# Patient Record
Sex: Female | Born: 1990 | Race: White | Hispanic: No | Marital: Single | Smoking: Never smoker
Health system: Southern US, Community
[De-identification: ages and names within clinical notes are randomized; demographics above are authoritative.]

## PROBLEM LIST (undated history)

## (undated) ENCOUNTER — Inpatient Hospital Stay (HOSPITAL_COMMUNITY): Payer: Self-pay

## (undated) DIAGNOSIS — F419 Anxiety disorder, unspecified: Secondary | ICD-10-CM

## (undated) DIAGNOSIS — B999 Unspecified infectious disease: Secondary | ICD-10-CM

## (undated) DIAGNOSIS — Z789 Other specified health status: Secondary | ICD-10-CM

## (undated) DIAGNOSIS — G43909 Migraine, unspecified, not intractable, without status migrainosus: Secondary | ICD-10-CM

## (undated) HISTORY — DX: Anxiety disorder, unspecified: F41.9

## (undated) HISTORY — DX: Migraine, unspecified, not intractable, without status migrainosus: G43.909

## (undated) HISTORY — PX: OTHER SURGICAL HISTORY: SHX169

## (undated) HISTORY — PX: WISDOM TOOTH EXTRACTION: SHX21

---
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
## 2011-09-24 ENCOUNTER — Emergency Department (HOSPITAL_COMMUNITY)
Admission: EM | Admit: 2011-09-24 | Discharge: 2011-09-24 | Disposition: A | Discharge status: Home / Self Care | Payer: Self-pay | Attending: Emergency Medicine | Admitting: Emergency Medicine

## 2011-09-24 ENCOUNTER — Encounter (HOSPITAL_COMMUNITY): Payer: Self-pay

## 2011-09-24 ENCOUNTER — Emergency Department (HOSPITAL_COMMUNITY): Payer: Self-pay

## 2011-09-24 DIAGNOSIS — Y92838 Other recreation area as the place of occurrence of the external cause: Secondary | ICD-10-CM | POA: Insufficient documentation

## 2011-09-24 DIAGNOSIS — S93401A Sprain of unspecified ligament of right ankle, initial encounter: Secondary | ICD-10-CM

## 2011-09-24 DIAGNOSIS — Y9239 Other specified sports and athletic area as the place of occurrence of the external cause: Secondary | ICD-10-CM | POA: Insufficient documentation

## 2011-09-24 DIAGNOSIS — S93409A Sprain of unspecified ligament of unspecified ankle, initial encounter: Secondary | ICD-10-CM | POA: Insufficient documentation

## 2011-09-24 DIAGNOSIS — X500XXA Overexertion from strenuous movement or load, initial encounter: Secondary | ICD-10-CM | POA: Insufficient documentation

## 2011-09-24 MED ORDER — IBUPROFEN 800 MG PO TABS: 800.0000 mg | ORAL_TABLET | Freq: Three times a day (TID) | ORAL | Status: AC

## 2011-09-24 MED ORDER — IBUPROFEN 800 MG PO TABS
800.0000 mg | ORAL_TABLET | Freq: Once | ORAL | Status: AC
  Administered 2011-09-24: 800 mg via ORAL
  Filled 2011-09-24: qty 1

## 2011-09-24 MED ORDER — TETANUS-DIPHTH-ACELL PERTUSSIS 5-2.5-18.5 LF-MCG/0.5 IM SUSP
0.5000 mL | Freq: Once | INTRAMUSCULAR | Status: AC
  Administered 2011-09-24: 0.5 mL via INTRAMUSCULAR
  Filled 2011-09-24: qty 0.5

## 2011-09-24 NOTE — Discharge Instructions (Signed)
Ankle Sprain An ankle sprain is an injury to the strong, fibrous tissues (ligaments) that hold the bones of your ankle joint together.  CAUSES Ankle sprain usually is caused by a fall or by twisting your ankle. People who participate in sports are more prone to these types of injuries.  SYMPTOMS  Symptoms of ankle sprain include:  Pain in your ankle. The pain may be present at rest or only when you are trying to stand or walk.   Swelling.   Bruising. Bruising may develop immediately or within 1 to 2 days after your injury.   Difficulty standing or walking.  DIAGNOSIS  Your caregiver will ask you details about your injury and perform a physical exam of your ankle to determine if you have an ankle sprain. During the physical exam, your caregiver will press and squeeze specific areas of your foot and ankle. Your caregiver will try to move your ankle in certain ways. An X-ray exam may be done to be sure a bone was not broken or a ligament did not separate from one of the bones in your ankle (avulsion).  TREATMENT  Certain types of braces can help stabilize your ankle. Your caregiver can make a recommendation for this. Your caregiver may recommend the use of medication for pain. If your sprain is severe, your caregiver may refer you to a surgeon who helps to restore function to parts of your skeletal system (orthopedist) or a physical therapist. HOME CARE INSTRUCTIONS  Apply ice to your injury for 1 to 2 days or as directed by your caregiver. Applying ice helps to reduce inflammation and pain.  Put ice in a plastic bag.   Place a towel between your skin and the bag.   Leave the ice on for 15 to 20 minutes at a time, every 2 hours while you are awake.   Take over-the-counter or prescription medicines for pain, discomfort, or fever only as directed by your caregiver.   Keep your injured leg elevated, when possible, to lessen swelling.   If your caregiver recommends crutches, use them as  instructed. Gradually, put weight on the affected ankle. Continue to use crutches or a cane until you can walk without feeling pain in your ankle.   If you have a plaster splint, wear the splint as directed by your caregiver. Do not rest it on anything harder than a pillow the first 24 hours. Do not put weight on it. Do not get it wet. You may take it off to take a shower or bath.   You may have been given an elastic bandage to wear around your ankle to provide support. If the elastic bandage is too tight (you have numbness or tingling in your foot or your foot becomes cold and blue), adjust the bandage to make it comfortable.   If you have an air splint, you may blow more air into it or let air out to make it more comfortable. You may take your splint off at night and before taking a shower or bath.   Wiggle your toes in the splint several times per day if you are able.   Soak ankle and feet in luke-warm water with Epson salt 4 times daily SEEK MEDICAL CARE IF:   You have an increase in bruising, swelling, or pain.   Your toes feel cold.   Pain relief is not achieved with medication.  SEEK IMMEDIATE MEDICAL CARE IF: Your toes are numb or blue or you have severe pain. MAKE  SURE YOU:   Understand these instructions.   Will watch your condition.   Will get help right away if you are not doing well or get worse.  Document Released: 04/12/2005 Document Revised: 04/01/2011 Document Reviewed: 11/15/2007 [facility] Patient Information 2012 [place], [place].

## 2011-09-24 NOTE — ED Notes (Signed)
Pt given ice pack to apply to ankle to help with swelling.

## 2011-09-24 NOTE — ED Notes (Signed)
Pt presents with R foot and ankle pain after jumping off a 4-foot fence last night, twisting ankle at impact.

## 2011-09-24 NOTE — Progress Notes (Signed)
Orthopedic Tech Progress Note Patient Details:  Marie Cochran [birth year] [id number]  Ortho Devices Type of Ortho Device: ASO;Crutches Ortho Device/Splint Location: right foot Ortho Device/Splint Interventions: Application   Christal Lagerstrom T 09/24/2011, 1:28 PM

## 2011-09-24 NOTE — ED Provider Notes (Signed)
History     CSN: [id number]  Arrival date & time 09/24/11  1136   First MD Initiated Contact with Patient 09/24/11 1143      Chief Complaint  Patient presents with  . Foot Pain    (Consider location/radiation/quality/duration/timing/severity/associated sxs/prior treatment) HPI  21 year old female presents complaining of right foot and ankle injury. Pt sts she was hanging out with friends last night at the pool after hour.  Sts she was "kicked out of the pool".  She went home then realized she has come return to retrieved her cell phone from pool. Patient reports she jumped off a 4 foot fence at the pool and landed on the right foot. Reports twisting her ankle at impact. Able to walk afterward but has notice increase pain and swelling to her ankle and foot.  Pain is throbbing, constant, worsening with movement and improves with rest. Also has a skin tear to the posterior aspect of foot from fall.  Do not recall last tetanus shot.  Denies knee or hip pain.  Denies any other injury.  Able to drive to ER today.  Has not taking anything for pain.    History reviewed. No pertinent past medical history.  History reviewed. No pertinent past surgical history.  No family history on file.  History  Substance Use Topics  . Smoking status: Never Smoker   . Smokeless tobacco: Not on file  . Alcohol Use: No    OB History    Grav Para Term Preterm Abortions TAB SAB Ect Mult Living                  Review of Systems  Constitutional: Negative for fever.  HENT: Negative for neck pain.   Musculoskeletal: Positive for joint swelling and gait problem. Negative for back pain.  Skin: Positive for wound.  Neurological: Negative for numbness and headaches.    Allergies  Review of patient's allergies indicates no known allergies.  Home Medications  No current outpatient prescriptions on file.  BP 116/72  Pulse 107  Temp(Src) 98.4 F (36.9 C) (Oral)  Resp 18  Ht 5\' 3"  (1.6 m)  Wt 131 lb  (59.421 kg)  BMI 23.21 kg/m2  SpO2 96%  LMP 09/23/2011  Physical Exam  Nursing note and vitals reviewed. Constitutional: She appears well-developed and well-nourished. No distress.  HENT:  Head: Atraumatic.  Eyes: Conjunctivae are normal.  Neck: Normal range of motion. Neck supple.  Musculoskeletal:       Right knee: Normal.       Right ankle: She exhibits decreased range of motion, swelling and ecchymosis. She exhibits no deformity. tenderness. Lateral malleolus, medial malleolus and head of 5th metatarsal tenderness found. No posterior TFL and no proximal fibula tenderness found.       Right foot: She exhibits decreased range of motion, tenderness, bony tenderness and swelling. She exhibits normal capillary refill, no crepitus and no deformity.       Feet:  Neurological: She is alert.  Skin: Skin is warm. No rash noted.    ED Course  Procedures (including critical care time)  Labs Reviewed - No data to display No results found.   No diagnosis found.  No results found for this or any previous visit. Dg Ankle Complete Right  09/24/2011  *RADIOLOGY REPORT*  Clinical Data: Lateral ankle pain.  Bruising.  RIGHT ANKLE - COMPLETE 3+ VIEW  Comparison: Foot 09/24/2011.  Findings: Marked lateral malleolar soft tissue swelling is present. There is no fracture identified.  The alignment the ankle is anatomic.  Lateral view is oblique.  Prominent posterior process of the talus.  Talar dome is intact.  IMPRESSION: Lateral malleolar soft tissue swelling.  Original Report Authenticated By: Andreas Newport, M.D.   Dg Foot Complete Right  09/24/2011  *RADIOLOGY REPORT*  Clinical Data: Right foot pain.  Swelling and bruising laterally.  RIGHT FOOT COMPLETE - 3+ VIEW  Comparison: None.  Findings: Anatomic alignment bones of the right foot.  Lateral soft tissue swelling is present over the midfoot and proximal forefoot. There is no fracture.  No radiopaque foreign body.  IMPRESSION: Mild lateral soft  tissue swelling of the foot.  Original Report Authenticated By: Andreas Newport, M.D.      MDM  Mechanical injury to R ankle and foot from jumping @ 38ft height.  Xray ordered.  Pt has some minor skin tears.  tdap given.  Ibuprofen for pain.   1:04 PM Xray of R ankle and foot shows soft tissue swelling but no acute fx or dislocation.  Reassurance given, Care instruction given, ASO and crutches provided. Referral provided.  Pt voice understanding and agrees with plan       Fayrene Helper, PA-C 09/24/11 1307

## 2011-09-24 NOTE — ED Provider Notes (Signed)
Medical screening examination/treatment/procedure(s) were performed by non-physician practitioner and as supervising physician I was immediately available for consultation/collaboration.    Lien Lyman L Devinn Hurwitz, MD 09/24/11 1915 

## 2011-10-06 ENCOUNTER — Emergency Department (INDEPENDENT_AMBULATORY_CARE_PROVIDER_SITE_OTHER): Payer: Self-pay

## 2011-10-06 ENCOUNTER — Emergency Department (INDEPENDENT_AMBULATORY_CARE_PROVIDER_SITE_OTHER)
Admission: EM | Admit: 2011-10-06 | Discharge: 2011-10-06 | Disposition: A | Discharge status: Home / Self Care | Payer: Self-pay | Source: Home / Self Care | Attending: Emergency Medicine | Admitting: Emergency Medicine

## 2011-10-06 ENCOUNTER — Encounter (HOSPITAL_COMMUNITY): Payer: Self-pay | Admitting: Emergency Medicine

## 2011-10-06 DIAGNOSIS — S9030XA Contusion of unspecified foot, initial encounter: Secondary | ICD-10-CM

## 2011-10-06 DIAGNOSIS — T148XXA Other injury of unspecified body region, initial encounter: Secondary | ICD-10-CM

## 2011-10-06 DIAGNOSIS — S9031XA Contusion of right foot, initial encounter: Secondary | ICD-10-CM

## 2011-10-06 LAB — DIFFERENTIAL
Lymphs Abs: 1.8 10*3/uL (ref 0.7–4.0)
Monocytes Relative: 6 % (ref 3–12)
Neutro Abs: 5.1 10*3/uL (ref 1.7–7.7)
Neutrophils Relative %: 69 % (ref 43–77)

## 2011-10-06 LAB — CBC
Hemoglobin: 14.1 g/dL (ref 12.0–15.0)
RBC: 4.38 MIL/uL (ref 3.87–5.11)

## 2011-10-06 MED ORDER — CEPHALEXIN 500 MG PO CAPS: 500.0000 mg | ORAL_CAPSULE | Freq: Three times a day (TID) | ORAL | Status: AC

## 2011-10-06 MED ORDER — TRAMADOL HCL 50 MG PO TABS: 100.0000 mg | ORAL_TABLET | Freq: Three times a day (TID) | ORAL | Status: AC | PRN

## 2011-10-06 MED ORDER — MELOXICAM 15 MG PO TABS: 15.0000 mg | ORAL_TABLET | Freq: Every day | ORAL | Status: DC

## 2011-10-06 NOTE — Discharge Instructions (Signed)
Contusion A contusion is a deep bruise. Contusions are the result of an injury that caused bleeding under the skin. The contusion may turn blue, purple, or yellow. Minor injuries will give you a painless contusion, but more severe contusions may stay painful and swollen for a few weeks.  CAUSES  A contusion is usually caused by a blow, trauma, or direct force to an area of the body. SYMPTOMS   Swelling and redness of the injured area.   Bruising of the injured area.   Tenderness and soreness of the injured area.   Pain.  DIAGNOSIS  The diagnosis can be made by taking a history and physical exam. An X-ray, CT scan, or MRI may be needed to determine if there were any associated injuries, such as fractures. TREATMENT  Specific treatment will depend on what area of the body was injured. In general, the best treatment for a contusion is resting, icing, elevating, and applying cold compresses to the injured area. Over-the-counter medicines may also be recommended for pain control. Ask your caregiver what the best treatment is for your contusion. HOME CARE INSTRUCTIONS   Put ice on the injured area.   Put ice in a plastic bag.   Place a towel between your skin and the bag.   Leave the ice on for 15 to 20 minutes, 3 to 4 times a day.   Only take over-the-counter or prescription medicines for pain, discomfort, or fever as directed by your caregiver. Your caregiver may recommend avoiding anti-inflammatory medicines (aspirin, ibuprofen, and naproxen) for 48 hours because these medicines may increase bruising.   Rest the injured area.   If possible, elevate the injured area to reduce swelling.  SEEK IMMEDIATE MEDICAL CARE IF:   You have increased bruising or swelling.   You have pain that is getting worse.   Your swelling or pain is not relieved with medicines.  MAKE SURE YOU:   Understand these instructions.   Will watch your condition.   Will get help right away if you are not  doing well or get worse.  Document Released: 01/20/2005 Document Revised: 04/01/2011 Document Reviewed: 02/15/2011 [facility] Patient Information 2012 [place], [place].Contusion (Bruise) of Foot Injury to the foot causes bruises (contusions). Contusions are caused by bleeding from small blood vessels that allow blood to leak out into the muscles, cord-like structures that attach muscle to bone (tendons), and/or other soft tissue.  CAUSES  Contusions of the foot are common. Bruises are frequently seen from:  Contact sports injuries.   The use of medications that thin the blood (anti-coagulants).   Aspirin and non-steroidal anti-inflammatory agents that decrease the clotting ability.   People with vitamin deficiencies.  SYMPTOMS  Signs of foot injury include pain and swelling. At first there may be discoloration from blood under the skin. This will appear blue to purple in color. As the bruise ages, the color turns yellow. Swelling may limit the movement of the toes.  Complications from foot injury may include:  Collections of blood leading to disability if calcium deposits form. These can later limit movement in the foot.   Infection of the foot if there are breaks in the skin.   Rupture of the tendons that may need surgical repair.  DIAGNOSIS  Diagnosing foot injuries can be made by observation. If problems continue, X-rays may be needed to make sure there are no broken bones (fractures). Continuing problems may require physical therapy.  HOME CARE INSTRUCTIONS   Apply ice to the injury for  15 to 20 minutes, 3 to 4 times per day. Put the ice in a plastic bag and place a towel between the bag of ice and your skin.   An elastic wrap (like an Ace bandage) may be used to keep swelling down.   Keep foot elevated to reduce swelling and discomfort.   Try to avoid standing or walking while the foot is painful. Do not resume use until instructed by your caregiver. Then begin use gradually. If  pain develops, decrease use and continue the above measures. Gradually increase activities that do not cause discomfort until you slowly have normal use.   Only take over-the-counter or prescription medicines for pain, discomfort, or fever as directed by your caregiver. Use only if your caregiver has not given medications that would interfere.   Begin daily rehabilitation exercises when supportive wrapping is no longer needed.   Use ice massage for 10 minutes before and after workouts. Fill a large styrofoam cup with water and freeze. Tear a small amount of foam from the top so ice protrudes. Massage ice firmly over the injured area in a circle about the size of a softball.   Always eat a well balanced diet.   Follow all instructions for follow up with your caregiver, any orthopedic referrals, physical therapy and rehabilitation. Any delay in obtaining necessary care could result in delayed healing, and temporary or permanent disability.  SEEK IMMEDIATE MEDICAL CARE IF:   Your pain and swelling increase, or pain is uncontrolled with medications.   You have loss of feeling in your foot, or your foot turns cold or blue.   An oral temperature above 102 F (38.9 C) develops, not controlled by medication.   Your foot becomes warm to touch, or you have more pain with movement of your toes.   You have a foot contusion that does not improve in 1 or 2 days.   Skin is broken and signs of infection occur (drainage, increasing pain, fever, headache, muscle aches, dizziness or a general ill feeling).   You develop new, unexplained symptoms, or an increase of the symptoms that brought you to your caregiver.  MAKE SURE YOU:   Understand these instructions.   Will watch your condition.   Will get help right away if you are not doing well or get worse.  Document Released: 02/01/2006 Document Revised: 04/01/2011 Document Reviewed: 03/16/2011 [facility] Patient Information 2012 [place],  [place].Hematoma A hematoma is a pocket of blood that collects under the skin, in an organ, in a body space, in a joint space, or in other tissue. The blood can clot to form a lump that you can see and feel. The lump is often firm, sore, and sometimes even painful and tender. Most hematomas get better in a few days to weeks. However, some hematomas may be serious and require medical care.Hematomas can range in size from very small to very large. CAUSES  A hematoma can be caused by a blunt or penetrating injury. It can also be caused by leakage from a blood vessel under the skin. Spontaneous leakage from a blood vessel is more likely to occur in elderly people, especially those taking blood thinners. Sometimes, a hematoma can develop after certain medical procedures. SYMPTOMS  Unlike a bruise, a hematoma forms a firm lump that you can feel. This lump is the collection of blood. The collection of blood can also cause your skin to turn a blue to dark blue color. If the hematoma is close to the surface  of the skin, it often produces a yellowish color in the skin. DIAGNOSIS  Your caregiver can determine whether you have a hematoma based on your history and a physical exam. TREATMENT  Hematomas usually go away on their own over time. Rarely does the blood need to be drained out of the body. HOME CARE INSTRUCTIONS   Put ice on the injured area.   Put ice in a plastic bag.   Place a towel between your skin and the bag.   Leave the ice on for 15 to 20 minutes, 3 to 4 times a day for the first 1 to 2 days.   After the first 2 days, switch to using warm compresses on the hematoma.   Elevate the injured area to help decrease pain and swelling. Wrapping the area with an elastic bandage may also be helpful. Compression helps to reduce swelling and promotes shrinking of the hematoma. Make sure the bandage is not wrapped too tight.   If your hematoma is on a lower extremity and is painful, crutches may be  helpful for a couple days.   Only take over-the-counter or prescription medicines for pain, discomfort, or fever as directed by your caregiver. Most patients can take acetaminophen or ibuprofen for the pain.  SEEK IMMEDIATE MEDICAL CARE IF:   You have increasing pain, or your pain is not controlled with medicine.   You have a fever.   You have worsening swelling or discoloration.   Your skin over the hematoma breaks or starts bleeding.  MAKE SURE YOU:   Understand these instructions.   Will watch your condition.   Will get help right away if you are not doing well or get worse.  Document Released: 11/25/2003 Document Revised: 04/01/2011 Document Reviewed: 12/14/2010 [facility] Patient Information 2012 [place], [place].

## 2011-10-06 NOTE — ED Provider Notes (Signed)
Chief Complaint  Patient presents with  . Foot Pain    History of Present Illness:   Marie Cochran is a 21 year old female who injured her right foot and ankle on May 30. She jumped about 4 feet over a fence, landing on her right foot. She did not hear a pop but it swelled up immediately. She went to the emergency room thereafter. X-rays were negative of the foot and ankle. She was given crutches and an ASO. The foot and ankle don't feel any better. She still has lots of swelling and pain it hurts to walk. She denies any numbness or tingling. There's been no fever or chills. She does have a couple of small breaks in the skin.  Review of Systems:  Other than noted above, the patient denies any of the following symptoms: Systemic:  No fevers, chills, sweats, or aches.  No fatigue or tiredness. Musculoskeletal:  No joint pain, arthritis, bursitis, swelling, back pain, or neck pain. Neurological:  No muscular weakness, paresthesias, headache, or trouble with speech or coordination.  No dizziness.   PMFSH:  Past medical history, family history, social history, meds, and allergies were reviewed.  Physical Exam:   Vital signs:  BP 131/76  Pulse 95  Temp 98.5 F (36.9 C) (Oral)  Resp 20  SpO2 98%  LMP 09/26/2011 Gen:  Alert and oriented times 3.  In no distress. Musculoskeletal: There is marketed swelling, redness, heat, and pain to palpation of the dorsum of foot. She has a couple of small abrasions on the heel, but these don't look infected. The entire foot is tender to palpation as is the ankle. The ankle has a limited range of motion with pain. Otherwise, all joints had a full a ROM with no swelling, bruising or deformity.  No edema, pulses full. Extremities were warm and pink.  Capillary refill was brisk.  Skin:  Clear, warm and dry.  No rash. Neuro:  Alert and oriented times 3.  Muscle strength was normal.  Sensation was intact to light touch.   Radiology:  Dg Ankle Complete Right  10/06/2011   *RADIOLOGY REPORT*  Clinical Data: Larey Seat, pain  RIGHT ANKLE - COMPLETE 3+ VIEW  Comparison: 09/24/2011  Findings: Moderate lateral soft tissue swelling.  No fracture or dislocation.  Similar appearance to priors.  IMPRESSION: Lateral soft tissue swelling persists.  Original Report Authenticated By: Elsie Stain, M.D.   Dg Foot Complete Right  10/06/2011  *RADIOLOGY REPORT*  Clinical Data: Fall.  Pain.  RIGHT FOOT COMPLETE - 3+ VIEW  Comparison: None.  Findings: Soft tissues of the foot appear swollen.  No fracture or dislocation is identified.  No radiopaque foreign body.  IMPRESSION: Soft tissue swelling without underlying acute bony or joint abnormality.  Original Report Authenticated By: Bernadene Bell. Maricela Curet, M.D.   Results for orders placed during the hospital encounter of 10/06/11  CBC      Component Value Range   WBC 7.4  4.0 - 10.5 K/uL   RBC 4.38  3.87 - 5.11 MIL/uL   Hemoglobin 14.1  12.0 - 15.0 g/dL   HCT 45.4  09.8 - 11.9 %   MCV 91.1  78.0 - 100.0 fL   MCH 32.2  26.0 - 34.0 pg   MCHC 35.3  30.0 - 36.0 g/dL   RDW 14.7  82.9 - 56.2 %   Platelets 323  150 - 400 K/uL  DIFFERENTIAL      Component Value Range   Neutrophils Relative 69  43 -  77 %   Neutro Abs 5.1  1.7 - 7.7 K/uL   Lymphocytes Relative 24  12 - 46 %   Lymphs Abs 1.8  0.7 - 4.0 K/uL   Monocytes Relative 6  3 - 12 %   Monocytes Absolute 0.5  0.1 - 1.0 K/uL   Eosinophils Relative 1  0 - 5 %   Eosinophils Absolute 0.1  0.0 - 0.7 K/uL   Basophils Relative 0  0 - 1 %   Basophils Absolute 0.0  0.0 - 0.1 K/uL    Course in Urgent Care Center:   She was placed in a [place] [facility] and told to continue to use of crutches and to followup with an orthopedist as soon as possible.  Assessment:  The primary encounter diagnosis was Contusion of right foot. A diagnosis of Hematoma was also pertinent to this visit. This could be just a hematoma is taking its time to resolve. Alternatively there might be some infection, but her white  blood cell count was normal. There could be an occult fracture.  Plan:   1.  The following meds were prescribed:   New Prescriptions   CEPHALEXIN (KEFLEX) 500 MG CAPSULE    Take 1 capsule (500 mg total) by mouth 3 (three) times daily.   MELOXICAM (MOBIC) 15 MG TABLET    Take 1 tablet (15 mg total) by mouth daily.   TRAMADOL (ULTRAM) 50 MG TABLET    Take 2 tablets (100 mg total) by mouth every 8 (eight) hours as needed for pain.   2.  The patient was instructed in symptomatic care, including rest and activity, elevation, application of ice and compression.  Appropriate handouts were given. 3.  The patient was told to return if becoming worse in any way, if no better in 3 or 4 days, and given some red flag symptoms that would indicate earlier return.   4.  The patient was told to follow up with Dr. Victorino Dike as soon as possible.   Reuben Likes, MD 10/06/11 2127

## 2011-10-06 NOTE — ED Notes (Signed)
Reports 13 days ago injured foot.  Patient climbed a fence, jumped down and landed wrong on foot.  Seen in ed for the same.  Told not broken.  Foot has continued to be painful.  Has a semicircle laceration to back of foot, achilles.  No bleeding. Visible, healing scrape to top of foot.  Foot is swollen, red, visible blue-purple around toes.  Pedal pulses palpable.  Able to move toes, but very painful

## 2011-10-06 NOTE — ED Notes (Signed)
Spoke to [facility] on [address].  Received 2 of the 3 scripts e-scribed. Tramadol not located by pharmacy. Called in script for tramadol.  Called in as documented on avs.

## 2011-10-28 ENCOUNTER — Encounter: Payer: Self-pay | Admitting: Family Medicine

## 2011-10-28 ENCOUNTER — Other Ambulatory Visit: Payer: Self-pay | Admitting: Family Medicine

## 2011-10-28 DIAGNOSIS — Z01419 Encounter for gynecological examination (general) (routine) without abnormal findings: Secondary | ICD-10-CM | POA: Insufficient documentation

## 2011-10-28 DIAGNOSIS — F419 Anxiety disorder, unspecified: Secondary | ICD-10-CM | POA: Insufficient documentation

## 2011-11-11 ENCOUNTER — Encounter: Payer: Self-pay | Admitting: Family Medicine

## 2011-11-11 ENCOUNTER — Ambulatory Visit (INDEPENDENT_AMBULATORY_CARE_PROVIDER_SITE_OTHER): Payer: Self-pay | Admitting: Family Medicine

## 2011-11-11 VITALS — BP 106/71 | HR 76 | Ht 64.0 in | Wt 131.3 lb

## 2011-11-11 DIAGNOSIS — S93409A Sprain of unspecified ligament of unspecified ankle, initial encounter: Secondary | ICD-10-CM

## 2011-11-11 NOTE — Progress Notes (Signed)
S: Pt comes in today for new patient visit. Previous PCP was Dr. Arvella Nigh (last seen May 2013), is changing clinics because Medicaid ran out.  Will be applying for orange card.   Last pap within 1 year- normal.   Works in the kitchen in a cafe, off for medical leave for ankle   RIGHT ANKLE SPRAIN Sprained it 5/30, went to ED and had xrays done which were negative.  Went to back to ED about 2-3 weeks later because was still having swelling- was given antibiotics and antiinflammatory at that time. Since that time, swelling improved a little but still having issues with this.  Redness improved after antibiotics.  Is able to bear weight now (w2as in a boot for a few weeks).  No longer icing ankle. Has better ROM than previously.  Wore the boot for about 2 weeks.  Also was given a brace.   Injury occurred after jumping a fence, landed on the right ankle.  Felt pain immediately, became swollen right away, unable to bear weight at that time.    ROS: Per HPI  History  Smoking status  . Never Smoker   Smokeless tobacco  . Never Used    O:  Filed Vitals:   11/11/11 1402  BP: 106/71  Pulse: 76    Gen: NAD CV: RRR, no murmur Pulm: CTA bilat, no wheezes or crackles Ext: Warm, no chronic skin changes; slight diffuse swelling of right ankle compared to left, no bruising, no edema, pedal pulses intact bilaterally; mildly limited ROM in right ankle, strength 5/5 in ankle, mild pain with resisted dorsi and plantar flexion, no pain with internal or external rotation   A/P: 21 y.o. female p/w new pt visit -See problem list -f/u in 2 weeks if not improved

## 2011-11-11 NOTE — Assessment & Plan Note (Signed)
Per pt, has improved some but is not back to baseline.  Negative ankle xray x2 (at time of injury and 3 weeks later)- could be occult fx but less likely.  Will continue ice and NSAIDs plus ABC exercises for strength/stretching.  F/u if still not improved in 2 weeks.  Unable to see Ortho bc no insurance and cannot afford to pay.

## 2011-11-11 NOTE — Patient Instructions (Signed)
It was nice to meet you today!  For your ankle, start wearing the brace again, every day.  Try to limit the amount of walking/standing as much as you can for now.  Keep it elevated. ICE it for at least 15 minutes at least 3 times a day, while there is still swelling. Keep moving your ankle-- write the alphabet with your whole foot at least 3 times a day. You could take ibuprofen 800mg  3 times a day for the next 2 weeks.    Come back in about 2 weeks.

## 2012-03-12 ENCOUNTER — Emergency Department (HOSPITAL_COMMUNITY): Payer: Self-pay

## 2012-03-12 ENCOUNTER — Encounter (HOSPITAL_COMMUNITY): Payer: Self-pay | Admitting: Surgery

## 2012-03-12 ENCOUNTER — Inpatient Hospital Stay (HOSPITAL_COMMUNITY)
Admission: EM | Admit: 2012-03-12 | Discharge: 2012-03-16 | DRG: 958 | Disposition: A | Discharge status: Rehabilitation Facility | Payer: MEDICAID | Attending: General Surgery | Admitting: General Surgery

## 2012-03-12 ENCOUNTER — Inpatient Hospital Stay (HOSPITAL_COMMUNITY): Payer: Self-pay

## 2012-03-12 ENCOUNTER — Other Ambulatory Visit: Payer: Self-pay

## 2012-03-12 DIAGNOSIS — F329 Major depressive disorder, single episode, unspecified: Secondary | ICD-10-CM | POA: Diagnosis present

## 2012-03-12 DIAGNOSIS — S56429A Laceration of extensor muscle, fascia and tendon of unspecified finger at forearm level, initial encounter: Secondary | ICD-10-CM | POA: Diagnosis present

## 2012-03-12 DIAGNOSIS — Y92410 Unspecified street and highway as the place of occurrence of the external cause: Secondary | ICD-10-CM

## 2012-03-12 DIAGNOSIS — F1010 Alcohol abuse, uncomplicated: Secondary | ICD-10-CM | POA: Diagnosis present

## 2012-03-12 DIAGNOSIS — S270XXA Traumatic pneumothorax, initial encounter: Secondary | ICD-10-CM

## 2012-03-12 DIAGNOSIS — S0120XA Unspecified open wound of nose, initial encounter: Secondary | ICD-10-CM | POA: Diagnosis present

## 2012-03-12 DIAGNOSIS — S61209A Unspecified open wound of unspecified finger without damage to nail, initial encounter: Secondary | ICD-10-CM | POA: Diagnosis present

## 2012-03-12 DIAGNOSIS — S42209B Unspecified fracture of upper end of unspecified humerus, initial encounter for open fracture: Principal | ICD-10-CM | POA: Diagnosis present

## 2012-03-12 DIAGNOSIS — S0181XA Laceration without foreign body of other part of head, initial encounter: Secondary | ICD-10-CM | POA: Diagnosis present

## 2012-03-12 DIAGNOSIS — S7000XA Contusion of unspecified hip, initial encounter: Secondary | ICD-10-CM | POA: Diagnosis present

## 2012-03-12 DIAGNOSIS — S0180XA Unspecified open wound of other part of head, initial encounter: Secondary | ICD-10-CM | POA: Diagnosis present

## 2012-03-12 DIAGNOSIS — S0003XA Contusion of scalp, initial encounter: Secondary | ICD-10-CM | POA: Diagnosis present

## 2012-03-12 DIAGNOSIS — S025XXA Fracture of tooth (traumatic), initial encounter for closed fracture: Secondary | ICD-10-CM | POA: Diagnosis present

## 2012-03-12 DIAGNOSIS — S42302B Unspecified fracture of shaft of humerus, left arm, initial encounter for open fracture: Secondary | ICD-10-CM | POA: Diagnosis present

## 2012-03-12 DIAGNOSIS — S01501A Unspecified open wound of lip, initial encounter: Secondary | ICD-10-CM | POA: Diagnosis present

## 2012-03-12 DIAGNOSIS — S060X9A Concussion with loss of consciousness of unspecified duration, initial encounter: Secondary | ICD-10-CM | POA: Diagnosis present

## 2012-03-12 DIAGNOSIS — S060XAA Concussion with loss of consciousness status unknown, initial encounter: Secondary | ICD-10-CM | POA: Diagnosis present

## 2012-03-12 DIAGNOSIS — S21119A Laceration without foreign body of unspecified front wall of thorax without penetration into thoracic cavity, initial encounter: Secondary | ICD-10-CM | POA: Diagnosis present

## 2012-03-12 DIAGNOSIS — R000 Tachycardia, unspecified: Secondary | ICD-10-CM | POA: Diagnosis present

## 2012-03-12 DIAGNOSIS — S61219A Laceration without foreign body of unspecified finger without damage to nail, initial encounter: Secondary | ICD-10-CM

## 2012-03-12 DIAGNOSIS — R404 Transient alteration of awareness: Secondary | ICD-10-CM | POA: Diagnosis present

## 2012-03-12 DIAGNOSIS — S301XXA Contusion of abdominal wall, initial encounter: Secondary | ICD-10-CM | POA: Diagnosis present

## 2012-03-12 DIAGNOSIS — S1190XA Unspecified open wound of unspecified part of neck, initial encounter: Secondary | ICD-10-CM | POA: Diagnosis present

## 2012-03-12 DIAGNOSIS — F3289 Other specified depressive episodes: Secondary | ICD-10-CM | POA: Diagnosis present

## 2012-03-12 DIAGNOSIS — S42309A Unspecified fracture of shaft of humerus, unspecified arm, initial encounter for closed fracture: Secondary | ICD-10-CM

## 2012-03-12 HISTORY — DX: Other specified health status: Z78.9

## 2012-03-12 LAB — SAMPLE TO BLOOD BANK

## 2012-03-12 LAB — COMPREHENSIVE METABOLIC PANEL
ALT: 279 U/L — ABNORMAL HIGH (ref 0–35)
AST: 511 U/L — ABNORMAL HIGH (ref 0–37)
Alkaline Phosphatase: 49 U/L (ref 39–117)
CO2: 18 mEq/L — ABNORMAL LOW (ref 19–32)
Calcium: 8.9 mg/dL (ref 8.4–10.5)
Chloride: 105 mEq/L (ref 96–112)
GFR calc Af Amer: >90 mL/min (ref >90)
GFR calc non Af Amer: >90 mL/min (ref >90)
Glucose, Bld: 196 mg/dL — ABNORMAL HIGH (ref 70–99)
Sodium: 141 mEq/L (ref 135–145)
Total Bilirubin: 0.3 mg/dL (ref 0.3–1.2)

## 2012-03-12 LAB — CBC
MCH: 32 pg (ref 26.0–34.0)
MCHC: 35.4 g/dL (ref 30.0–36.0)
Platelets: 354 10*3/uL (ref 150–400)
RDW: 12.7 % (ref 11.5–15.5)

## 2012-03-12 LAB — URINALYSIS, MICROSCOPIC ONLY
Glucose, UA: NEGATIVE mg/dL
Ketones, ur: NEGATIVE mg/dL
Leukocytes, UA: NEGATIVE
Protein, ur: 100 mg/dL — AB
Urobilinogen, UA: 0.2 mg/dL (ref 0.0–1.0)

## 2012-03-12 LAB — POCT I-STAT, CHEM 8
Calcium, Ion: 1.19 mmol/L (ref 1.12–1.23)
Chloride: 110 mEq/L (ref 96–112)
Glucose, Bld: 190 mg/dL — ABNORMAL HIGH (ref 70–99)
HCT: 42 % (ref 36.0–46.0)
Hemoglobin: 14.3 g/dL (ref 12.0–15.0)
Potassium: 3.1 mEq/L — ABNORMAL LOW (ref 3.5–5.1)

## 2012-03-12 LAB — LACTIC ACID, PLASMA: Lactic Acid, Venous: 3.9 mmol/L — ABNORMAL HIGH (ref 0.5–2.2)

## 2012-03-12 LAB — RAPID URINE DRUG SCREEN, HOSP PERFORMED
Benzodiazepines: POSITIVE — AB
Opiates: POSITIVE — AB

## 2012-03-12 LAB — ETHANOL: Alcohol, Ethyl (B): 36 mg/dL — ABNORMAL HIGH (ref 0–11)

## 2012-03-12 MED ORDER — MORPHINE SULFATE 4 MG/ML IJ SOLN: 4.0000 mg | Freq: Once | INTRAMUSCULAR | Status: AC

## 2012-03-12 MED ORDER — HYDROMORPHONE HCL PF 1 MG/ML IJ SOLN
1.0000 mg | INTRAMUSCULAR | Status: DC | PRN
  Administered 2012-03-13: 1 mg via INTRAVENOUS
  Filled 2012-03-12: qty 1

## 2012-03-12 MED ORDER — CEFAZOLIN SODIUM 1-5 GM-% IV SOLN
1.0000 g | Freq: Three times a day (TID) | INTRAVENOUS | Status: DC
  Administered 2012-03-13 – 2012-03-16 (×10): 1 g via INTRAVENOUS
  Filled 2012-03-12 (×13): qty 50

## 2012-03-12 MED ORDER — MORPHINE SULFATE 2 MG/ML IJ SOLN
INTRAMUSCULAR | Status: AC
  Administered 2012-03-12: 4 mg via INTRAVENOUS
  Filled 2012-03-12: qty 2

## 2012-03-12 MED ORDER — IOHEXOL 300 MG/ML  SOLN
100.0000 mL | Freq: Once | INTRAMUSCULAR | Status: AC | PRN
  Administered 2012-03-12: 100 mL via INTRAVENOUS

## 2012-03-12 MED ORDER — TETANUS-DIPHTH-ACELL PERTUSSIS 5-2.5-18.5 LF-MCG/0.5 IM SUSP
0.5000 mL | Freq: Once | INTRAMUSCULAR | Status: AC
  Administered 2012-03-12: 0.5 mL via INTRAMUSCULAR

## 2012-03-12 MED ORDER — CEFAZOLIN SODIUM 1-5 GM-% IV SOLN
1.0000 g | Freq: Once | INTRAVENOUS | Status: AC
  Administered 2012-03-12 – 2012-03-13 (×2): 1 g via INTRAVENOUS
  Filled 2012-03-12: qty 50

## 2012-03-12 MED ORDER — PANTOPRAZOLE SODIUM 40 MG PO TBEC
40.0000 mg | DELAYED_RELEASE_TABLET | Freq: Every day | ORAL | Status: DC
  Administered 2012-03-16: 40 mg via ORAL
  Filled 2012-03-12: qty 1

## 2012-03-12 MED ORDER — HYDROMORPHONE HCL PF 1 MG/ML IJ SOLN: 1.0000 mg | INTRAMUSCULAR | Status: DC | PRN

## 2012-03-12 MED ORDER — KCL IN DEXTROSE-NACL 20-5-0.9 MEQ/L-%-% IV SOLN
INTRAVENOUS | Status: DC
  Administered 2012-03-12 – 2012-03-14 (×3): via INTRAVENOUS
  Administered 2012-03-15: 75 mL/h via INTRAVENOUS
  Administered 2012-03-15: 75 mL via INTRAVENOUS
  Filled 2012-03-12 (×10): qty 1000

## 2012-03-12 MED ORDER — CEFAZOLIN SODIUM 1-5 GM-% IV SOLN: 1.0000 g | Freq: Three times a day (TID) | INTRAVENOUS | Status: DC

## 2012-03-12 MED ORDER — ONDANSETRON HCL 4 MG PO TABS: 4.0000 mg | ORAL_TABLET | Freq: Four times a day (QID) | ORAL | Status: DC | PRN

## 2012-03-12 MED ORDER — HYDROMORPHONE HCL PF 1 MG/ML IJ SOLN
1.0000 mg | INTRAMUSCULAR | Status: DC | PRN
  Administered 2012-03-12 (×2): 1 mg via INTRAVENOUS
  Filled 2012-03-12 (×2): qty 1

## 2012-03-12 MED ORDER — PANTOPRAZOLE SODIUM 40 MG IV SOLR
40.0000 mg | Freq: Every day | INTRAVENOUS | Status: DC
  Administered 2012-03-13 – 2012-03-15 (×3): 40 mg via INTRAVENOUS
  Filled 2012-03-12 (×5): qty 40

## 2012-03-12 MED ORDER — TETANUS-DIPHTH-ACELL PERTUSSIS 5-2.5-18.5 LF-MCG/0.5 IM SUSP
INTRAMUSCULAR | Status: AC
  Filled 2012-03-12: qty 0.5

## 2012-03-12 MED ORDER — ONDANSETRON HCL 4 MG/2ML IJ SOLN
4.0000 mg | Freq: Four times a day (QID) | INTRAMUSCULAR | Status: DC | PRN
  Administered 2012-03-12: 4 mg via INTRAVENOUS
  Filled 2012-03-12: qty 2

## 2012-03-12 NOTE — ED Notes (Signed)
Pt stated "Is the baby getting enough oxygen?".  Urine pregnancy added to labwork and Dr. Luisa Hart notified.
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
## 2012-03-12 NOTE — ED Notes (Signed)
Pt returned from CT scan.
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
## 2012-03-12 NOTE — ED Notes (Signed)
Pt arrived to CT scan 
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
## 2012-03-12 NOTE — ED Notes (Signed)
Ortho at bedside to assess and irrigate wound.
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
## 2012-03-13 ENCOUNTER — Inpatient Hospital Stay (HOSPITAL_COMMUNITY): Payer: Self-pay

## 2012-03-13 ENCOUNTER — Encounter (HOSPITAL_COMMUNITY): Payer: Self-pay | Admitting: Anesthesiology

## 2012-03-13 ENCOUNTER — Inpatient Hospital Stay (HOSPITAL_COMMUNITY): Payer: MEDICAID

## 2012-03-13 ENCOUNTER — Encounter (HOSPITAL_COMMUNITY): Admission: EM | Disposition: A | Discharge status: Rehabilitation Facility | Payer: Self-pay | Source: Home / Self Care

## 2012-03-13 ENCOUNTER — Inpatient Hospital Stay (HOSPITAL_COMMUNITY): Payer: MEDICAID | Admitting: Anesthesiology

## 2012-03-13 DIAGNOSIS — S060X9A Concussion with loss of consciousness of unspecified duration, initial encounter: Secondary | ICD-10-CM | POA: Diagnosis present

## 2012-03-13 DIAGNOSIS — S42302B Unspecified fracture of shaft of humerus, left arm, initial encounter for open fracture: Secondary | ICD-10-CM | POA: Diagnosis present

## 2012-03-13 DIAGNOSIS — S270XXA Traumatic pneumothorax, initial encounter: Secondary | ICD-10-CM | POA: Diagnosis present

## 2012-03-13 DIAGNOSIS — S61219A Laceration without foreign body of unspecified finger without damage to nail, initial encounter: Secondary | ICD-10-CM | POA: Diagnosis present

## 2012-03-13 DIAGNOSIS — S21119A Laceration without foreign body of unspecified front wall of thorax without penetration into thoracic cavity, initial encounter: Secondary | ICD-10-CM | POA: Diagnosis present

## 2012-03-13 DIAGNOSIS — S0181XA Laceration without foreign body of other part of head, initial encounter: Secondary | ICD-10-CM | POA: Diagnosis present

## 2012-03-13 HISTORY — PX: TENDON REPAIR: SHX5111

## 2012-03-13 HISTORY — PX: ORIF HUMERUS FRACTURE: SHX2126

## 2012-03-13 LAB — COMPREHENSIVE METABOLIC PANEL
BUN: 11 mg/dL (ref 6–23)
CO2: 23 mEq/L (ref 19–32)
Chloride: 106 mEq/L (ref 96–112)
Creatinine, Ser: 0.61 mg/dL (ref 0.50–1.10)
GFR calc Af Amer: >90 mL/min (ref >90)
GFR calc non Af Amer: >90 mL/min (ref >90)
Glucose, Bld: 137 mg/dL — ABNORMAL HIGH (ref 70–99)
Total Bilirubin: 0.3 mg/dL (ref 0.3–1.2)

## 2012-03-13 LAB — CBC
HCT: 37.1 % (ref 36.0–46.0)
MCV: 90.9 fL (ref 78.0–100.0)
RBC: 4.08 MIL/uL (ref 3.87–5.11)
WBC: 15 10*3/uL — ABNORMAL HIGH (ref 4.0–10.5)

## 2012-03-13 LAB — MRSA PCR SCREENING: MRSA by PCR: NEGATIVE

## 2012-03-13 LAB — CDS SEROLOGY

## 2012-03-13 SURGERY — OPEN REDUCTION INTERNAL FIXATION (ORIF) PROXIMAL HUMERUS FRACTURE
Anesthesia: General | Site: Shoulder | Laterality: Left | Wound class: Dirty or Infected

## 2012-03-13 MED ORDER — ROCURONIUM BROMIDE 100 MG/10ML IV SOLN
INTRAVENOUS | Status: DC | PRN
  Administered 2012-03-13: 50 mg via INTRAVENOUS

## 2012-03-13 MED ORDER — SODIUM CHLORIDE 0.9 % IR SOLN
Status: DC | PRN
  Administered 2012-03-13: 1000 mL

## 2012-03-13 MED ORDER — OXYCODONE HCL 5 MG PO TABS: 5.0000 mg | ORAL_TABLET | Freq: Once | ORAL | Status: DC | PRN

## 2012-03-13 MED ORDER — OXYCODONE HCL 5 MG/5ML PO SOLN: 5.0000 mg | Freq: Once | ORAL | Status: DC | PRN

## 2012-03-13 MED ORDER — ONDANSETRON HCL 4 MG/2ML IJ SOLN
INTRAMUSCULAR | Status: DC | PRN
  Administered 2012-03-13: 4 mg via INTRAVENOUS

## 2012-03-13 MED ORDER — NALOXONE HCL 0.4 MG/ML IJ SOLN: 0.4000 mg | INTRAMUSCULAR | Status: DC | PRN

## 2012-03-13 MED ORDER — SODIUM CHLORIDE 0.9 % IV SOLN
25.0000 ug/h | INTRAVENOUS | Status: DC
  Administered 2012-03-14: 100 ug/h via INTRAVENOUS
  Filled 2012-03-13 (×2): qty 50

## 2012-03-13 MED ORDER — DIPHENHYDRAMINE HCL 50 MG/ML IJ SOLN: 12.5000 mg | Freq: Four times a day (QID) | INTRAMUSCULAR | Status: DC | PRN

## 2012-03-13 MED ORDER — CEFAZOLIN SODIUM-DEXTROSE 2-3 GM-% IV SOLR
INTRAVENOUS | Status: AC
  Filled 2012-03-13: qty 50

## 2012-03-13 MED ORDER — BACITRACIN ZINC 500 UNIT/GM EX OINT
TOPICAL_OINTMENT | Freq: Two times a day (BID) | CUTANEOUS | Status: DC
  Administered 2012-03-13 – 2012-03-14 (×4): via TOPICAL
  Administered 2012-03-15: 1 via TOPICAL
  Administered 2012-03-16: 09:00:00 via TOPICAL
  Filled 2012-03-13: qty 15

## 2012-03-13 MED ORDER — LACTATED RINGERS IV SOLN
INTRAVENOUS | Status: DC | PRN
  Administered 2012-03-13 (×4): via INTRAVENOUS

## 2012-03-13 MED ORDER — PROPOFOL 10 MG/ML IV EMUL
5.0000 ug/kg/min | INTRAVENOUS | Status: DC
  Administered 2012-03-13: 40 ug/kg/min via INTRAVENOUS
  Filled 2012-03-13: qty 100

## 2012-03-13 MED ORDER — MIDAZOLAM HCL 5 MG/5ML IJ SOLN
INTRAMUSCULAR | Status: DC | PRN
  Administered 2012-03-13: 2 mg via INTRAVENOUS

## 2012-03-13 MED ORDER — BIOTENE DRY MOUTH MT LIQD
15.0000 mL | Freq: Four times a day (QID) | OROMUCOSAL | Status: DC
  Administered 2012-03-14 (×4): 15 mL via OROMUCOSAL

## 2012-03-13 MED ORDER — LIDOCAINE HCL (CARDIAC) 20 MG/ML IV SOLN
INTRAVENOUS | Status: DC | PRN
  Administered 2012-03-13: 80 mg via INTRAVENOUS

## 2012-03-13 MED ORDER — METOCLOPRAMIDE HCL 5 MG/ML IJ SOLN
10.0000 mg | Freq: Once | INTRAMUSCULAR | Status: AC | PRN
  Filled 2012-03-13: qty 2

## 2012-03-13 MED ORDER — SODIUM CHLORIDE 0.9 % IJ SOLN: 9.0000 mL | INTRAMUSCULAR | Status: DC | PRN

## 2012-03-13 MED ORDER — PROPOFOL 10 MG/ML IV EMUL
INTRAVENOUS | Status: AC
  Filled 2012-03-13: qty 100

## 2012-03-13 MED ORDER — CHLORHEXIDINE GLUCONATE 0.12 % MT SOLN
OROMUCOSAL | Status: AC
  Administered 2012-03-14: 15 mL via OROMUCOSAL
  Filled 2012-03-13: qty 15

## 2012-03-13 MED ORDER — DIPHENHYDRAMINE HCL 12.5 MG/5ML PO ELIX
12.5000 mg | ORAL_SOLUTION | Freq: Four times a day (QID) | ORAL | Status: DC | PRN
  Filled 2012-03-13: qty 5

## 2012-03-13 MED ORDER — NEOSTIGMINE METHYLSULFATE 1 MG/ML IJ SOLN
INTRAMUSCULAR | Status: DC | PRN
  Administered 2012-03-13: 4 mg via INTRAVENOUS

## 2012-03-13 MED ORDER — ALBUMIN HUMAN 5 % IV SOLN
INTRAVENOUS | Status: DC | PRN
  Administered 2012-03-13: 20:00:00 via INTRAVENOUS

## 2012-03-13 MED ORDER — GLYCOPYRROLATE 0.2 MG/ML IJ SOLN
INTRAMUSCULAR | Status: DC | PRN
  Administered 2012-03-13: 0.6 mg via INTRAVENOUS

## 2012-03-13 MED ORDER — PROPOFOL 10 MG/ML IV BOLUS
INTRAVENOUS | Status: DC | PRN
  Administered 2012-03-13: 60 mg via INTRAVENOUS
  Administered 2012-03-13: 150 mg via INTRAVENOUS
  Administered 2012-03-13: 50 mg via INTRAVENOUS

## 2012-03-13 MED ORDER — PHENYLEPHRINE HCL 10 MG/ML IJ SOLN
INTRAMUSCULAR | Status: DC | PRN
  Administered 2012-03-13: 40 ug via INTRAVENOUS

## 2012-03-13 MED ORDER — VECURONIUM BROMIDE 10 MG IV SOLR
INTRAVENOUS | Status: DC | PRN
  Administered 2012-03-13 (×3): 1 mg via INTRAVENOUS
  Administered 2012-03-13: 2 mg via INTRAVENOUS

## 2012-03-13 MED ORDER — HYDROMORPHONE 0.3 MG/ML IV SOLN
INTRAVENOUS | Status: DC
  Administered 2012-03-13 (×2): via INTRAVENOUS
  Filled 2012-03-13: qty 25

## 2012-03-13 MED ORDER — FENTANYL BOLUS VIA INFUSION
25.0000 ug | Freq: Four times a day (QID) | INTRAVENOUS | Status: DC | PRN
  Filled 2012-03-13: qty 100

## 2012-03-13 MED ORDER — HYDROMORPHONE HCL PF 1 MG/ML IJ SOLN: 0.2500 mg | INTRAMUSCULAR | Status: DC | PRN

## 2012-03-13 MED ORDER — CHLORHEXIDINE GLUCONATE 0.12 % MT SOLN
15.0000 mL | Freq: Two times a day (BID) | OROMUCOSAL | Status: DC
  Administered 2012-03-14 (×3): 15 mL via OROMUCOSAL
  Filled 2012-03-13 (×2): qty 15

## 2012-03-13 MED ORDER — ARTIFICIAL TEARS OP OINT
TOPICAL_OINTMENT | OPHTHALMIC | Status: DC | PRN
  Administered 2012-03-13: 1 via OPHTHALMIC

## 2012-03-13 MED ORDER — DOUBLE ANTIBIOTIC 500-10000 UNIT/GM EX OINT
TOPICAL_OINTMENT | CUTANEOUS | Status: AC
  Filled 2012-03-13: qty 1

## 2012-03-13 MED ORDER — FENTANYL CITRATE 0.05 MG/ML IJ SOLN
INTRAMUSCULAR | Status: DC | PRN
  Administered 2012-03-13 (×2): 25 ug via INTRAVENOUS
  Administered 2012-03-13 (×3): 50 ug via INTRAVENOUS
  Administered 2012-03-13: 25 ug via INTRAVENOUS
  Administered 2012-03-13: 100 ug via INTRAVENOUS
  Administered 2012-03-13: 25 ug via INTRAVENOUS
  Administered 2012-03-13: 50 ug via INTRAVENOUS
  Administered 2012-03-13: 100 ug via INTRAVENOUS

## 2012-03-13 SURGICAL SUPPLY — 89 items
BANDAGE ELASTIC 4 VELCRO ST LF (GAUZE/BANDAGES/DRESSINGS) ×2 IMPLANT
BANDAGE GAUZE 4  KLING STR (GAUZE/BANDAGES/DRESSINGS) ×2 IMPLANT
BIT DRILL 2.8X4 QC CORT (BIT) ×2 IMPLANT
BIT DRILL 4 LONG FAST STEP (BIT) ×2 IMPLANT
BIT DRILL 4 SHORT FAST STEP (BIT) ×2 IMPLANT
BIT DRILL SNP 4.0MM (BIT) ×1 IMPLANT
BNDG CMPR 9X4 STRL LF SNTH (GAUZE/BANDAGES/DRESSINGS) ×3
BNDG ESMARK 4X9 LF (GAUZE/BANDAGES/DRESSINGS) ×2 IMPLANT
CHLORAPREP W/TINT 26ML (MISCELLANEOUS) ×4 IMPLANT
CLOTH BEACON ORANGE TIMEOUT ST (SAFETY) ×4 IMPLANT
CORDS BIPOLAR (ELECTRODE) ×2 IMPLANT
COVER SURGICAL LIGHT HANDLE (MISCELLANEOUS) ×8 IMPLANT
DRAPE C-ARM 42X72 X-RAY (DRAPES) ×4 IMPLANT
DRAPE EXTREMITY T 121X128X90 (DRAPE) ×2 IMPLANT
DRAPE INCISE IOBAN 66X45 STRL (DRAPES) ×4 IMPLANT
DRAPE PROXIMA HALF (DRAPES) ×4 IMPLANT
DRAPE SURG 17X23 STRL (DRAPES) ×4 IMPLANT
DRAPE U-SHAPE 47X51 STRL (DRAPES) ×4 IMPLANT
DRILL BIT SNP 4.0MM (BIT) ×1
DRSG ADAPTIC 3X8 NADH LF (GAUZE/BANDAGES/DRESSINGS) ×2 IMPLANT
DRSG EMULSION OIL 3X3 NADH (GAUZE/BANDAGES/DRESSINGS) ×4 IMPLANT
DRSG MEPILEX BORDER 4X8 (GAUZE/BANDAGES/DRESSINGS) IMPLANT
DRSG PAD ABDOMINAL 8X10 ST (GAUZE/BANDAGES/DRESSINGS) ×4 IMPLANT
ELECT REM PT RETURN 9FT ADLT (ELECTROSURGICAL) ×4
ELECTRODE REM PT RTRN 9FT ADLT (ELECTROSURGICAL) ×3 IMPLANT
GAUZE XEROFORM 5X9 LF (GAUZE/BANDAGES/DRESSINGS) ×2 IMPLANT
GLOVE BIO SURGEON STRL SZ7 (GLOVE) ×4 IMPLANT
GLOVE BIO SURGEON STRL SZ7.5 (GLOVE) ×4 IMPLANT
GLOVE BIOGEL PI IND STRL 8 (GLOVE) ×3 IMPLANT
GLOVE BIOGEL PI INDICATOR 8 (GLOVE) ×1
GOWN PREVENTION PLUS LG XLONG (DISPOSABLE) ×4 IMPLANT
GOWN PREVENTION PLUS XLARGE (GOWN DISPOSABLE) ×4 IMPLANT
GOWN STRL NON-REIN LRG LVL3 (GOWN DISPOSABLE) ×8 IMPLANT
KIT BASIN OR (CUSTOM PROCEDURE TRAY) ×6 IMPLANT
KIT ROOM TURNOVER OR (KITS) ×4 IMPLANT
MANIFOLD NEPTUNE II (INSTRUMENTS) ×4 IMPLANT
NDL SUT 2 .5 CRC MAYO 1.732X (NEEDLE) ×3 IMPLANT
NDL SUT 6 .5 CRC .975X.05 MAYO (NEEDLE) ×3 IMPLANT
NEEDLE 22X1 1/2 (OR ONLY) (NEEDLE) ×2 IMPLANT
NEEDLE MAYO TAPER (NEEDLE) ×8
NS IRRIG 1000ML POUR BTL (IV SOLUTION) ×4 IMPLANT
PACK ORTHO EXTREMITY (CUSTOM PROCEDURE TRAY) ×2 IMPLANT
PACK SHOULDER (CUSTOM PROCEDURE TRAY) ×4 IMPLANT
PAD ARMBOARD 7.5X6 YLW CONV (MISCELLANEOUS) ×8 IMPLANT
PADDING CAST ABS 4INX4YD NS (CAST SUPPLIES) ×1
PADDING CAST ABS COTTON 4X4 ST (CAST SUPPLIES) ×1 IMPLANT
PEG STND 4.0X20.0MM (Orthopedic Implant) ×4 IMPLANT
PEG STND 4.0X25.0MM (Orthopedic Implant) ×4 IMPLANT
PEG STND 4.0X30MM (Orthopedic Implant) ×4 IMPLANT
PEG STND 4.0X40MM (Orthopedic Implant) ×4 IMPLANT
PEG STND 4.0X45.0MM (Orthopedic Implant) ×4 IMPLANT
PEGSTD 4.0X20.0MM (Orthopedic Implant) ×1 IMPLANT
PEGSTD 4.0X30MM (Orthopedic Implant) ×1 IMPLANT
PEGSTD 4.0X40MM (Orthopedic Implant) ×1 IMPLANT
PEGSTD 4.0X45.0MM (Orthopedic Implant) ×1 IMPLANT
PENCIL BUTTON HOLSTER BLD 10FT (ELECTRODE) ×2 IMPLANT
PIN GUIDE SHOULDER 2.0MM (PIN) ×2 IMPLANT
PLATE SHOULDER S3 4HOLE LT (Plate) ×2 IMPLANT
SCREW LOCK 90D ANGLED 3.8X26 (Screw) ×2 IMPLANT
SCREW LOCK 90D ANGLED 3.8X28 (Screw) ×2 IMPLANT
SCREW MULTIDIR SS 3.8X28 HUMRL (Screw) ×2 IMPLANT
SET ASEPTIC TRANSFER (MISCELLANEOUS) ×2 IMPLANT
SLING ARM IMMOBILIZER LRG (SOFTGOODS) ×2 IMPLANT
SPLINT PLASTER EXTRA FAST 3X15 (CAST SUPPLIES) ×1
SPLINT PLASTER GYPS XFAST 3X15 (CAST SUPPLIES) ×1 IMPLANT
SPONGE GAUZE 4X4 12PLY (GAUZE/BANDAGES/DRESSINGS) ×4 IMPLANT
SPONGE LAP 18X18 X RAY DECT (DISPOSABLE) ×8 IMPLANT
STAPLER VISISTAT 35W (STAPLE) ×4 IMPLANT
STRIP CLOSURE SKIN 1/2X4 (GAUZE/BANDAGES/DRESSINGS) ×4 IMPLANT
SUCTION FRAZIER TIP 10 FR DISP (SUCTIONS) ×6 IMPLANT
SUPPORT WRAP ARM LG (MISCELLANEOUS) ×4 IMPLANT
SUT BONE WAX W31G (SUTURE) ×2 IMPLANT
SUT ETHIBOND NAB CT1 #1 30IN (SUTURE) ×8 IMPLANT
SUT FIBERWIRE #2 38 T-5 BLUE (SUTURE) ×16
SUT MNCRL AB 4-0 PS2 18 (SUTURE) ×4 IMPLANT
SUT PROLENE 6 0 P 1 18 (SUTURE) ×2 IMPLANT
SUT VIC AB 0 CT1 27 (SUTURE) ×4
SUT VIC AB 0 CT1 27XBRD ANBCTR (SUTURE) ×3 IMPLANT
SUT VIC AB 2-0 CT1 27 (SUTURE) ×8
SUT VIC AB 2-0 CT1 TAPERPNT 27 (SUTURE) ×6 IMPLANT
SUT VICRYL 4-0 PS2 18IN ABS (SUTURE) ×4 IMPLANT
SUT VICRYL RAPIDE 4/0 PS 2 (SUTURE) ×2 IMPLANT
SUTURE FIBERWR #2 38 T-5 BLUE (SUTURE) ×8 IMPLANT
SYR CONTROL 10ML LL (SYRINGE) ×2 IMPLANT
TAPE CLOTH SURG 6X10 WHT LF (GAUZE/BANDAGES/DRESSINGS) ×2 IMPLANT
TOWEL OR 17X24 6PK STRL BLUE (TOWEL DISPOSABLE) ×4 IMPLANT
TOWEL OR 17X26 10 PK STRL BLUE (TOWEL DISPOSABLE) ×4 IMPLANT
WATER STERILE IRR 1000ML POUR (IV SOLUTION) ×4 IMPLANT
YANKAUER SUCT BULB TIP NO VENT (SUCTIONS) ×4 IMPLANT
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
## 2012-03-13 NOTE — Transfer of Care (Signed)
Immediate Anesthesia Transfer of Care Note  Patient: Marie Cochran  Procedure(s) Performed: Procedure(s) (LRB) with comments: OPEN REDUCTION INTERNAL FIXATION (ORIF) PROXIMAL HUMERUS FRACTURE (Left) - Irrigation and debridement done at 2058. TENDON REPAIR () - start 2123  Patient Location: SICU  Anesthesia Type:General  Level of Consciousness: sedated and unresponsive  Airway & Oxygen Therapy: Patient remains intubated per anesthesia plan  Post-op Assessment: Report given to PACU RN and Post -op Vital signs reviewed and stable  Post vital signs: Reviewed  Complications: No apparent anesthesia complications

## 2012-03-13 NOTE — Progress Notes (Signed)
Patient awoke and removed own c-collar.  When asked if her neck hurt, she mumbled yes.  Aspen collar replaced.  Patient resting.
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
## 2012-03-13 NOTE — Anesthesia Procedure Notes (Signed)
Procedure Name: Intubation Date/Time: 03/13/2012 6:25 PM Performed by: Gayla Medicus Pre-anesthesia Checklist: Patient identified, Timeout performed, Emergency Drugs available, Suction available and Patient being monitored Patient Re-evaluated:Patient Re-evaluated prior to inductionOxygen Delivery Method: Circle system utilized Preoxygenation: Pre-oxygenation with 100% oxygen Intubation Type: IV induction Ventilation: Mask ventilation without difficulty Laryngoscope Size: Mac and 3 Grade View: Grade I Tube type: Oral Tube size: 7.5 mm Number of attempts: 1 Airway Equipment and Method: Video-laryngoscopy and Stylet Placement Confirmation: ETT inserted through vocal cords under direct vision,  positive ETCO2 and breath sounds checked- equal and bilateral Secured at: 23 cm Tube secured with: Tape Dental Injury: Teeth and Oropharynx as per pre-operative assessment

## 2012-03-13 NOTE — Anesthesia Preprocedure Evaluation (Signed)
Anesthesia Evaluation  Patient identified by MRN, date of birth, ID band Patient awake    Reviewed: Allergy & Precautions, H&P , NPO status , Patient's Chart, lab work & pertinent test results, reviewed documented beta blocker date and time   Airway Mallampati: II TM Distance: >3 FB Neck ROM: full  Mouth opening: Limited Mouth Opening  Dental   Pulmonary  breath sounds clear to auscultation  + decreased breath sounds      Cardiovascular Rhythm:regular     Neuro/Psych  Neuromuscular disease  C-spine cleared negative neurological ROS  negative psych ROS   GI/Hepatic negative GI ROS, Neg liver ROS,   Endo/Other  negative endocrine ROS  Renal/GU negative Renal ROS  negative genitourinary   Musculoskeletal   Abdominal   Peds  Hematology negative hematology ROS (+)   Anesthesia Other Findings See surgeon's H&P   Reproductive/Obstetrics negative OB ROS                           Anesthesia Physical Anesthesia Plan  ASA: IV  Anesthesia Plan: General   Post-op Pain Management:    Induction: Intravenous  Airway Management Planned: Oral ETT and Video Laryngoscope Planned  Additional Equipment:   Intra-op Plan:   Post-operative Plan: Extubation in OR  Informed Consent: I have reviewed the patients History and Physical, chart, labs and discussed the procedure including the risks, benefits and alternatives for the proposed anesthesia with the patient or authorized representative who has indicated his/her understanding and acceptance.   Dental Advisory Given  Plan Discussed with: CRNA and Surgeon  Anesthesia Plan Comments: (See surgeon's H&P )        Anesthesia Quick Evaluation
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
## 2012-03-13 NOTE — Preoperative (Signed)
Beta Blockers   Reason not to administer Beta Blockers: not prescribed 

## 2012-03-13 NOTE — Progress Notes (Signed)
UR complete 

## 2012-03-13 NOTE — Progress Notes (Signed)
She is very somnolent, needs facial lacs irrigated but I don't think they can be closed today, or today for humerus, follow up flex/ex
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
## 2012-03-14 ENCOUNTER — Encounter (HOSPITAL_COMMUNITY): Payer: Self-pay | Admitting: Orthopedic Surgery

## 2012-03-14 ENCOUNTER — Inpatient Hospital Stay (HOSPITAL_COMMUNITY): Payer: MEDICAID

## 2012-03-14 DIAGNOSIS — S56429A Laceration of extensor muscle, fascia and tendon of unspecified finger at forearm level, initial encounter: Secondary | ICD-10-CM | POA: Diagnosis present

## 2012-03-14 DIAGNOSIS — J95821 Acute postprocedural respiratory failure: Secondary | ICD-10-CM

## 2012-03-14 LAB — GLUCOSE, CAPILLARY: Glucose-Capillary: 98 mg/dL (ref 70–99)

## 2012-03-14 LAB — URINE CULTURE

## 2012-03-14 LAB — BLOOD GAS, ARTERIAL
Acid-Base Excess: 1.5 mmol/L (ref 0.0–2.0)
Bicarbonate: 26.4 mEq/L — ABNORMAL HIGH (ref 20.0–24.0)
FIO2: 0.5 %
Patient temperature: 99.1
TCO2: 27.9 mmol/L (ref 0–100)
pH, Arterial: 7.361 (ref 7.350–7.450)

## 2012-03-14 MED ORDER — MORPHINE SULFATE (PF) 1 MG/ML IV SOLN
INTRAVENOUS | Status: DC
Start: 2012-03-14 — End: 2012-03-16
  Administered 2012-03-14: 20:00:00 via INTRAVENOUS
  Administered 2012-03-14 – 2012-03-15 (×2): 2 mg via INTRAVENOUS
  Administered 2012-03-15: 5 mg via INTRAVENOUS
  Administered 2012-03-15: 1 mg via INTRAVENOUS
  Administered 2012-03-15: 6 mg via INTRAVENOUS
  Administered 2012-03-15 – 2012-03-16 (×2): 2 mg via INTRAVENOUS
  Filled 2012-03-14: qty 25

## 2012-03-14 MED ORDER — BIOTENE DRY MOUTH MT LIQD
15.0000 mL | Freq: Two times a day (BID) | OROMUCOSAL | Status: DC
  Administered 2012-03-15: 15 mL via OROMUCOSAL

## 2012-03-14 NOTE — Anesthesia Postprocedure Evaluation (Signed)
  Anesthesia Post-op Note  Patient: Marie Cochran  Procedure(s) Performed: Procedure(s) (LRB) with comments: OPEN REDUCTION INTERNAL FIXATION (ORIF) PROXIMAL HUMERUS FRACTURE (Left) - Irrigation and debridement done at 2058. TENDON REPAIR () - start 2123  Patient Location: SICU  Anesthesia Type:General  Level of Consciousness: sedated and Patient remains intubated per anesthesia plan  Airway and Oxygen Therapy: Patient remains intubated per anesthesia plan and weaning ventilator today  Post-op Pain: none  Post-op Assessment: Post-op Vital signs reviewed  Post-op Vital Signs: stable  Complications: No apparent anesthesia complications

## 2012-03-14 NOTE — Progress Notes (Signed)
[name] MRN: [id number] DOB: 1990/12/17   Cancelled Treatment:    Reason Eval/Treat Not Completed: Medical issues which prohibited therapy.  Per RN- plans to extubate pt this morning.    [name] 03/14/2012, 11:54 AM [name], SPT Acute Rehab Services [phone]

## 2012-03-14 NOTE — Op Note (Signed)
NAME:  Utt, Marivel L               ACCOUNT NO.:  [id number]  MEDICAL RECORD NO.:  [id number]  LOCATION:  2311                         FACILITY:  [facility]  PHYSICIAN:  Artist Pais. Terrell Ostrand, M.D.DATE OF BIRTH:  1990/10/11  DATE OF PROCEDURE:  03/13/2012 DATE OF DISCHARGE:                              OPERATIVE REPORT   PREOPERATIVE DIAGNOSIS:  Left index extensor tendon laceration x2 and left long finger extensor laceration.  POSTOPERATIVE DIAGNOSIS:  Left index extensor tendon laceration x2 and left long finger extensor laceration.  PROCEDURE:  Primary repair of EDC and EDP tendons to the index finger and EDC to the long finger.  SURGEON:  Artist Pais. Mina Marble, MD  ASSISTANT:  None.  ANESTHESIA:  General.  COMPLICATIONS:  No complications.  DESCRIPTION OF PROCEDURE:  The patient was taken to the operating suite for open reduction and internal fixation of a complex left proximal humerus fracture by Dr. Jones Broom.  Once this procedure was finished, the patient was laid supine and the left upper extremity was then re-prepped and draped with a tourniquet placed on the forearm.  An Esmarch was used to exsanguinate the limb.  Tourniquet was inflated to 200 mmHg at this point in time.  Lacerations of the index and long MCP joint areas were extended proximally in gentle fashion.  Dissection was carried down to the index finger MCP joint area.  There was complete laceration of the extensor digitorum communis and extensor indicis proprius tendon to the index finger.  The laceration of the long finger was extended proximally as well, and the extensor digitorum communis long finger was partially lacerated.  The index finger of tendon was repaired with 3-0 FiberWire with horizontal mattress sutures followed by 6-0 Prolene epitendinous suture.  The same procedure was done for the long finger.  The wounds were irrigated and loosely closed with 4-0 Vicryl Rapide.  Xeroform, 4x4s, and  volar splint was applied.  The patient tolerated procedure well and went to recovery room in stable fashion.     Artist Pais Mina Marble, M.D.     MAW/MEDQ  D:  03/13/2012  T:  03/14/2012  Job:  [id number]

## 2012-03-14 NOTE — Op Note (Signed)
03/12/2012 - 03/13/2012  9:41 AM  PATIENT:  Marie Cochran  21 y.o. female  PRE-OPERATIVE DIAGNOSIS:  l proximal humerus fracture  POST-OPERATIVE DIAGNOSIS:  Same  PROCEDURE:  OPEN REDUCTION INTERNAL FIXATION (ORIF) PROXIMAL HUMERUS FRACTURE, TENDON REPAIR  SURGEON:  Marlowe Shores, MD  ANESTHESIA:   General  DICTATION ID:  [id number]

## 2012-03-14 NOTE — Progress Notes (Signed)
Agree with above.  Will work toward extubation today.

## 2012-03-14 NOTE — Progress Notes (Addendum)
PATIENT ID: Marie Cochran   1 Day Post-Op Procedure(s) (LRB): OPEN REDUCTION INTERNAL FIXATION (ORIF) PROXIMAL HUMERUS FRACTURE (Left) TENDON REPAIR ()  Subjective: The patient remained intubated overnight. She underwent extensor tendon repair left index finger after her shoulder surgery last night.  Objective:  Filed Vitals:   03/14/12 0715  BP: 108/59  Pulse: 82  Temp:   Resp: 33     Examination of the left shoulder shows the dressing to be intact. Moderate swelling. Distally capillary refill is less than 2 seconds in the fingers are warm. Unable to perform neurologic exam secondary to intubated sedated status.  Labs:   [facility] 03/13/12 0445 03/12/12 1923 03/12/12 1900  HGB 12.6 14.3 14.3   Basename 03/13/12 0445 03/12/12 1923 03/12/12 1900  WBC 15.0* -- 16.7*  RBC 4.08 -- 4.47  HCT 37.1 42.0 --  PLT 258 -- 354   Basename 03/13/12 0445 03/12/12 1923 03/12/12 1900  NA 142 143 --  K 4.2 3.1* --  CL 106 110 --  CO2 23 -- 18*  BUN 11 10 --  CREATININE 0.61 0.90 --  GLUCOSE 137* 190* --  CALCIUM 8.5 -- 8.9    Assessment and Plan: Status post ORIF grade 3 open left proximal humerus fracture Sling at all times. No motion of the left upper extremity. Will need secondary survey when more alert   IV antibiotics x48 hours postoperatively  VTE proph: Per trauma team
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
## 2012-03-14 NOTE — Progress Notes (Signed)
OT Cancellation Note  Patient Details Name: Marie Cochran MRN: [id number] DOB: Jul 18, 1990   Cancelled Treatment:    Reason Eval/Treat Not Completed: Medical issues which prohibited therapy.  Currently, pt. Is on vent and sedated awaiting possible facial surgery per RN.  Will check back this pm as able.  Jeani Hawking M [phone] 03/14/2012, 10:10 AM

## 2012-03-14 NOTE — Consult Note (Signed)
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 sinuses are hypoplastic.  Mastoid air cells clear.  IMPRESSION: 1.  Negative CT brain. 2.  Forehead laceration and left temporal parietal scalp hematoma.  CT MAXILLOFACIAL  Findings:  Disconjugate gaze.  Nasal bones intact.  Pterygoid plates intact.  Mandibular condyles located.  Artifact is present from dental hardware and studied in the tongue.  This obscures portions of the mandible.  Mild left temporomandibular joint osteoarthritis.  Radiopaque density is present in the right side of the floor of the mouth (image number 20 series 4).  Correlation with inspection is recommended.  Laceration over the left side of the chin is present.  There is posterior depression of the maxillary incisors (image number 19 series 4).  Recommend correlation with physical exam.  No displaced alveolar ridge fracture is identified.  Potentially this represents dental avulsion.  IMPRESSION:  1.  No definite mandibular fracture identified.  Posterior position of the mandibular incisors.  Clinically correlate for dental avulsion or nondisplaced mandibular fracture. 2.  Left chin laceration with debris in the soft tissues. 3.  Right forehead laceration. 4.  Radiopaque object  in the floor of the mouth on the right. Correlation with inspection recommended.  Potentially these could be tooth fragments.  CT CERVICAL SPINE  Findings:   That is rotated to the right relative to the use cervical spine.  Gas is present in the right upper chest wall. There is also venous gas in the upper mediastinum.  No cervical spine fracture or dislocation is evident.  Craniocervical alignment is normal.  IMPRESSION: No cervical spine fracture or dislocation.   Original Report Authenticated By: Andreas Newport, M.D.     ROS:@ROS @  Blood pressure 109/51, pulse 112, temperature 99.8 F (37.7 C), temperature source Oral, resp. rate 14, height 5\' 6"  (1.676 m), weight 67.9 kg (149 lb 11.1 oz), last menstrual period 02/28/2012, SpO2 98.00%.  PHYSICAL EXAM: General appearance - intubated and sedated Eyes - pupils equal and reactive, extraocular eye movements intact  Ears - bilateral TM's and external ear canals normal Mouth - mucous membranes moist, pharynx normal without lesions and no lacerations, dentition intact Neck - supple, no significant adenopathy Multiple complex facial lacerations involving forehead (8 cm) and chin (3 cm)  Procedure: Debridement and complex closure of lacerations Dictation [phone]    Studies Reviewed:Facial CT  Assessment/Plan: Lacerations closed Cont ABX for 10 days  Wound care: 1/2 str H2O2 and Bacitracin oint bid F/U as OP for suture removal    Loyal Rudy 03/14/2012, 12:44 PM

## 2012-03-14 NOTE — Progress Notes (Signed)
Pt back on full support. Placed back on sedation due to upcoming surgery on face.

## 2012-03-14 NOTE — Progress Notes (Signed)
Kendahl Bumgardner Ingold,PT Acute Rehabilitation [phone] (pager)  

## 2012-03-14 NOTE — Progress Notes (Signed)
Pt intubated and sleeping. Possible extubation tomorrow? SLP will f/u for cognitive eval. Harlon Ditty, MA CCC-SLP [phone]

## 2012-03-14 NOTE — Procedures (Signed)
Extubation Procedure Note  Patient Details:   Name: Marie Cochran DOB: 03-28-1991 MRN: [id number]   Airway Documentation:     Evaluation  O2 sats: stable throughout and currently acceptable Complications: No apparent complications Patient did tolerate procedure well. Bilateral Breath Sounds: Clear Suctioning: Airway Yes Pt awake. Extubated per MD order, placed on 3L [place], sat 99%. Negative cuff leak, PA aware and order for extubation. BBS cl, HR 109, RR 16. Pt able to vocalize.  Arloa Koh 03/14/2012, 4:57 PM

## 2012-03-14 NOTE — Consult Note (Signed)
  Called last PM by Dr. Ave Filter for intraop finding of left hand extensor tendon lacerations. I performed left index and long extensor tendon repairs after shoulder ORIF  Will need to see in my office next week

## 2012-03-15 ENCOUNTER — Inpatient Hospital Stay (HOSPITAL_COMMUNITY): Payer: MEDICAID

## 2012-03-15 DIAGNOSIS — S069X9A Unspecified intracranial injury with loss of consciousness of unspecified duration, initial encounter: Secondary | ICD-10-CM

## 2012-03-15 DIAGNOSIS — S72033A Displaced midcervical fracture of unspecified femur, initial encounter for closed fracture: Secondary | ICD-10-CM

## 2012-03-15 MED ORDER — INFLUENZA VIRUS VACC SPLIT PF IM SUSP
0.5000 mL | INTRAMUSCULAR | Status: AC
  Administered 2012-03-16: 0.5 mL via INTRAMUSCULAR
  Filled 2012-03-15: qty 0.5
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
## 2012-03-15 NOTE — Progress Notes (Signed)
PATIENT ID: Marie Cochran   2 Days Post-Op Procedure(s) (LRB): OPEN REDUCTION INTERNAL FIXATION (ORIF) PROXIMAL HUMERUS FRACTURE (Left) TENDON REPAIR ()  Subjective: Patients nods yes or no to questions. Has some soreness of left shoulder/arm. No other noted complaints or concerns.  Objective:  Filed Vitals:   03/15/12 0812  BP: 112/69  Pulse: 107  Temp: 98.7 F (37.1 C)  Resp: 21     Awert, somewhat responsive to yes/no questions Dressing saturated, was removed today and cleaned with alcohol Staples and sutures intact Incision appears benign, no mod swelling, erythema or warmth. Some tenderness to palpation of the left shoulder globally, no evidence of infection Reports intact sensation to light touch distally  Labs:   Basename 03/13/12 0445 03/12/12 1923 03/12/12 1900  HGB 12.6 14.3 14.3   Basename 03/13/12 0445 03/12/12 1923 03/12/12 1900  WBC 15.0* -- 16.7*  RBC 4.08 -- 4.47  HCT 37.1 42.0 --  PLT 258 -- 354   Basename 03/13/12 0445 03/12/12 1923 03/12/12 1900  NA 142 143 --  K 4.2 3.1* --  CL 106 110 --  CO2 23 -- 18*  BUN 11 10 --  CREATININE 0.61 0.90 --  GLUCOSE 137* 190* --  CALCIUM 8.5 -- 8.9    Assessment and Plan: 2 days postop left ORIF of proximal humerus fracture Left upper extremity dressings were removed today.  The incision appears dry and is clean. If patient or nursing would like to apply another dressing that is fine. Continue to wear her sling at all times.  No motion of the left upper extremity. Unable to perform adequate secondary survey, we will hold off until patient is more alert   VTE proph: per trauma team
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
## 2012-03-15 NOTE — Progress Notes (Signed)
Rehab Admissions Coordinator Note:  Patient was screened by Brock Ra for appropriateness for an Inpatient Acute Rehab Consult.  Request for screening made by OT w/ agreement of ST& PT.  At this time, we are recommending Inpatient Rehab consult.  Brock Ra 03/15/2012, 12:55 PM  I can be reached at [phone].

## 2012-03-15 NOTE — Progress Notes (Signed)
Marie Cochran,PT Acute Rehabilitation [phone] (pager)  
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
## 2012-03-16 ENCOUNTER — Encounter (HOSPITAL_COMMUNITY): Payer: Self-pay | Admitting: General Practice

## 2012-03-16 ENCOUNTER — Inpatient Hospital Stay (HOSPITAL_COMMUNITY)
Admission: RE | Admit: 2012-03-16 | Discharge: 2012-03-21 | DRG: 945 | Disposition: A | Discharge status: Home Health | Payer: MEDICAID | Source: Ambulatory Visit | Attending: Physical Medicine & Rehabilitation | Admitting: Physical Medicine & Rehabilitation

## 2012-03-16 ENCOUNTER — Inpatient Hospital Stay (HOSPITAL_COMMUNITY): Payer: MEDICAID

## 2012-03-16 ENCOUNTER — Encounter: Payer: Self-pay | Admitting: Family Medicine

## 2012-03-16 DIAGNOSIS — S0003XA Contusion of scalp, initial encounter: Secondary | ICD-10-CM

## 2012-03-16 DIAGNOSIS — F3289 Other specified depressive episodes: Secondary | ICD-10-CM

## 2012-03-16 DIAGNOSIS — F329 Major depressive disorder, single episode, unspecified: Secondary | ICD-10-CM

## 2012-03-16 DIAGNOSIS — S069X9A Unspecified intracranial injury with loss of consciousness of unspecified duration, initial encounter: Secondary | ICD-10-CM

## 2012-03-16 DIAGNOSIS — Y92410 Unspecified street and highway as the place of occurrence of the external cause: Secondary | ICD-10-CM

## 2012-03-16 DIAGNOSIS — S069XAA Unspecified intracranial injury with loss of consciousness status unknown, initial encounter: Secondary | ICD-10-CM

## 2012-03-16 DIAGNOSIS — Z5189 Encounter for other specified aftercare: Principal | ICD-10-CM

## 2012-03-16 DIAGNOSIS — S270XXA Traumatic pneumothorax, initial encounter: Secondary | ICD-10-CM

## 2012-03-16 DIAGNOSIS — S61209A Unspecified open wound of unspecified finger without damage to nail, initial encounter: Secondary | ICD-10-CM

## 2012-03-16 DIAGNOSIS — S42293B Other displaced fracture of upper end of unspecified humerus, initial encounter for open fracture: Secondary | ICD-10-CM

## 2012-03-16 DIAGNOSIS — E876 Hypokalemia: Secondary | ICD-10-CM

## 2012-03-16 DIAGNOSIS — G43909 Migraine, unspecified, not intractable, without status migrainosus: Secondary | ICD-10-CM

## 2012-03-16 DIAGNOSIS — F1010 Alcohol abuse, uncomplicated: Secondary | ICD-10-CM

## 2012-03-16 DIAGNOSIS — F411 Generalized anxiety disorder: Secondary | ICD-10-CM

## 2012-03-16 DIAGNOSIS — Z79899 Other long term (current) drug therapy: Secondary | ICD-10-CM

## 2012-03-16 DIAGNOSIS — S0180XA Unspecified open wound of other part of head, initial encounter: Secondary | ICD-10-CM

## 2012-03-16 LAB — CBC
MCV: 89.9 fL (ref 78.0–100.0)
Platelets: 221 10*3/uL (ref 150–400)
RBC: 3.17 MIL/uL — ABNORMAL LOW (ref 3.87–5.11)
RDW: 12 % (ref 11.5–15.5)
WBC: 7.9 10*3/uL (ref 4.0–10.5)

## 2012-03-16 LAB — COMPREHENSIVE METABOLIC PANEL
Albumin: 2.5 g/dL — ABNORMAL LOW (ref 3.5–5.2)
Alkaline Phosphatase: 41 U/L (ref 39–117)
BUN: 6 mg/dL (ref 6–23)
Chloride: 108 mEq/L (ref 96–112)
Creatinine, Ser: 0.44 mg/dL — ABNORMAL LOW (ref 0.50–1.10)
GFR calc Af Amer: >90 mL/min (ref >90)
Glucose, Bld: 112 mg/dL — ABNORMAL HIGH (ref 70–99)
Total Bilirubin: 0.5 mg/dL (ref 0.3–1.2)
Total Protein: 6 g/dL (ref 6.0–8.3)

## 2012-03-16 MED ORDER — ONDANSETRON HCL 4 MG PO TABS: 4.0000 mg | ORAL_TABLET | Freq: Four times a day (QID) | ORAL | Status: DC | PRN

## 2012-03-16 MED ORDER — SORBITOL 70 % SOLN
30.0000 mL | Freq: Every day | Status: DC | PRN
  Administered 2012-03-17 – 2012-03-19 (×2): 30 mL via ORAL
  Filled 2012-03-16 (×3): qty 30

## 2012-03-16 MED ORDER — PANTOPRAZOLE SODIUM 40 MG IV SOLR
40.0000 mg | Freq: Every day | INTRAVENOUS | Status: DC
  Filled 2012-03-16 (×5): qty 40

## 2012-03-16 MED ORDER — ACETAMINOPHEN 325 MG PO TABS
325.0000 mg | ORAL_TABLET | ORAL | Status: DC | PRN
  Administered 2012-03-20: 650 mg via ORAL
  Filled 2012-03-16: qty 2

## 2012-03-16 MED ORDER — HYDROCODONE-ACETAMINOPHEN 10-325 MG PO TABS
0.5000 | ORAL_TABLET | ORAL | Status: DC | PRN
  Administered 2012-03-16: 2 via ORAL
  Filled 2012-03-16: qty 2

## 2012-03-16 MED ORDER — MORPHINE SULFATE 4 MG/ML IJ SOLN
4.0000 mg | INTRAMUSCULAR | Status: DC | PRN
  Administered 2012-03-16: 4 mg via INTRAVENOUS
  Filled 2012-03-16: qty 1

## 2012-03-16 MED ORDER — POLYETHYLENE GLYCOL 3350 17 G PO PACK
17.0000 g | PACK | Freq: Every day | ORAL | Status: DC | PRN
  Filled 2012-03-16: qty 1

## 2012-03-16 MED ORDER — BOOST PLUS PO LIQD
237.0000 mL | Freq: Two times a day (BID) | ORAL | Status: DC
  Filled 2012-03-16 (×3): qty 237

## 2012-03-16 MED ORDER — ONDANSETRON HCL 4 MG/2ML IJ SOLN: 4.0000 mg | Freq: Four times a day (QID) | INTRAMUSCULAR | Status: DC | PRN

## 2012-03-16 MED ORDER — HYDROCODONE-ACETAMINOPHEN 10-325 MG PO TABS
0.5000 | ORAL_TABLET | ORAL | Status: DC | PRN
  Administered 2012-03-16 – 2012-03-17 (×2): 2 via ORAL
  Administered 2012-03-17: 1 via ORAL
  Administered 2012-03-17 – 2012-03-19 (×8): 2 via ORAL
  Administered 2012-03-20: 1 via ORAL
  Administered 2012-03-20: 2 via ORAL
  Administered 2012-03-20 (×2): 1 via ORAL
  Administered 2012-03-21 (×2): 2 via ORAL
  Filled 2012-03-16 (×4): qty 2
  Filled 2012-03-16: qty 1
  Filled 2012-03-16 (×3): qty 2
  Filled 2012-03-16: qty 1
  Filled 2012-03-16 (×3): qty 2
  Filled 2012-03-16: qty 1
  Filled 2012-03-16: qty 2
  Filled 2012-03-16: qty 1
  Filled 2012-03-16 (×2): qty 2

## 2012-03-16 MED ORDER — PANTOPRAZOLE SODIUM 40 MG PO TBEC
40.0000 mg | DELAYED_RELEASE_TABLET | Freq: Every day | ORAL | Status: DC
  Administered 2012-03-17 – 2012-03-21 (×5): 40 mg via ORAL
  Filled 2012-03-16 (×5): qty 1

## 2012-03-16 MED ORDER — ADULT MULTIVITAMIN W/MINERALS CH
1.0000 | ORAL_TABLET | Freq: Every day | ORAL | Status: DC
  Filled 2012-03-16: qty 1

## 2012-03-16 MED ORDER — BACITRACIN ZINC 500 UNIT/GM EX OINT
TOPICAL_OINTMENT | Freq: Two times a day (BID) | CUTANEOUS | Status: DC
  Administered 2012-03-17 – 2012-03-18 (×5): via TOPICAL
  Administered 2012-03-19: 1 via TOPICAL
  Administered 2012-03-19 – 2012-03-20 (×2): via TOPICAL
  Filled 2012-03-16: qty 15
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
## 2012-03-16 NOTE — Progress Notes (Signed)
Pt sitting up in the bed.  No complaints of pain.  No needs at this time.  Marie Cochran

## 2012-03-16 NOTE — Progress Notes (Signed)
Advance diet and check labs Stable to go to CIR today I spoke to her mom Patient examined and I agree with the assessment and plan  Violeta Gelinas, MD, MPH, FACS Pager: [phone]  03/16/2012 9:48 AM

## 2012-03-16 NOTE — Progress Notes (Signed)
PATIENT ID: Marie Cochran        Subjective: Feeling better, no new msk complaints.  Transferred to rehab.  Objective:  Filed Vitals:   03/16/12 1500  BP: 118/70  Pulse: 81  Temp: 98.8 F (37.1 C)  Resp: 18     LUE dressing c/d/i, inc c/d/i sml serosang drng from wounds. Distally wiggles all fingers and thumb, nstlt tips of all fingers and thumb  Labs:   Basename 03/16/12 1037  HGB 10.0*   Basename 03/16/12 1037  WBC 7.9  RBC 3.17*  HCT 28.5*  PLT 221   Basename 03/16/12 1037  NA 141  K 3.8  CL 108  CO2 25  BUN 6  CREATININE 0.44*  GLUCOSE 112*  CALCIUM 8.6    Assessment and Plan:Sling at all times for LUE, dressing changes PRN Will follow along.  If d/c'd f/u in 10-14 days, [phone]  VTE proph: per rehab
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
## 2012-03-16 NOTE — Plan of Care (Signed)
Overall Plan of Care [facility]) Patient Details Name: Marie Cochran MRN: [id number] DOB: 08-16-1990  Diagnosis:    Primary Diagnosis:    TBI (traumatic brain injury) Co-morbidities: pain, wound care  Functional Problem List  Patient demonstrates impairments in the following areas: Balance, Behavior, Bladder, Bowel, Cognition, Edema, Medication Management, Motor, Pain, Safety and Vision  Basic ADL's: eating, grooming, bathing, dressing and toileting Advanced ADL's: simple meal preparation  Transfers:  bed mobility, bed to chair, toilet, tub/shower, car and furniture Locomotion:  ambulation and stairs  Additional Impairments:  Social Cognition   problem solving, memory, attention and awareness, Leisure Awareness and Discharge Disposition  Anticipated Outcomes Item Anticipated Outcome  Eating/Swallowing    Basic self-care  Supervision  Tolieting  Mod I  Bowel/Bladder  Mod independent   Transfers  Mod I  Locomotion  Mod I  Production designer, theatre/television/film  Supervision with basic and Min assist with complex  Pain  Min assist   Safety/Judgment  Mod independent   Other     Therapy Plan:   OT Frequency: 1-2 X/day, 60-90 minutes SLP Frequency: 1-2 X/day, 30-60 minutes;5 out of 7 days   Team Interventions: Item RN PT OT SLP SW TR Other  Self Care/Advanced ADL Retraining   x      Neuromuscular Re-Education  x x      Therapeutic Activities  x x x     UE/LE Strength Training/ROM  x x      UE/LE Coordination Activities  x x      Visual/Perceptual Remediation/Compensation   x      DME/Adaptive Equipment Instruction  x x      Therapeutic Exercise  x x      Balance/Vestibular Training  x x      Patient/Family Education x x x x     Cognitive Remediation/Compensation  x x x     Functional Mobility Training  x x      Ambulation/Gait Training  x       Furniture conservator/restorer  Reintegration  x       Dysphagia/Aspiration Film/video editor         Bladder Management         Bowel Management         Disease Management/Prevention x        Pain Management x x       Medication Management x   x     Skin Care/Wound Management x        Splinting/Orthotics  x x      Discharge Planning  x x x     Psychosocial Support x  x                         Team Discharge Planning: Destination:  Home Projected Follow-up:  PT, OT and SLP Outpatient Projected Equipment Needs:  None Patient/family involved in discharge planning:  Yes  MD ELOS: 5-7 DAYS Medical Rehab Prognosis:  Excellent Assessment: Pt admitted for CIR therapies. The team will be addressing self-care, fxnl mobility, cognition, NMR, balance, pain, adaptive techniques, education. Goals are mod I to supervision.

## 2012-03-16 NOTE — Discharge Summary (Signed)
Merilyn Pagan, MD, MPH, FACS Pager: [phone]  
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
## 2012-03-16 NOTE — Progress Notes (Signed)
Patient received at 1425 via bed . Alert and oriented x4 left arm in sling  Forehead laceration with sutures  Intact . Scabbing to chin dressing to chest . Oriented to room and call bell system . Patient and mother verbalized understanding of admission process. Continue with plan of care .             Marie Cochran

## 2012-03-16 NOTE — Progress Notes (Signed)
Chaplain visited patient as a follow-up. Patient was alert and responsive. Mother was in the room during Chaplain's visit. Chaplain shared words of encouragement and hope with patient and family member. Chaplain also provided ministry of empathetic listening and presence. Chaplain gave patient a prayer shawl as a gift from the Spiritual Care Department. Patient and family expressed appreciation for Chaplain visit and support. Chaplain will pass on to the Unit Chaplain for follow-up as needed.
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
## 2012-03-16 NOTE — Progress Notes (Signed)
UR completed 

## 2012-03-16 NOTE — Progress Notes (Signed)
Rehab admissions - Evaluated for possible admission.  I spoke with patient and her mom.  Mom has convinced patient that she needs to come to inpatient rehab.  Bed available and can admit to inpatient rehab today.  Call me for questions.  #[phone]

## 2012-03-17 ENCOUNTER — Inpatient Hospital Stay (HOSPITAL_COMMUNITY): Payer: MEDICAID | Admitting: Physical Therapy

## 2012-03-17 ENCOUNTER — Inpatient Hospital Stay (HOSPITAL_COMMUNITY): Payer: Self-pay | Admitting: Occupational Therapy

## 2012-03-17 ENCOUNTER — Inpatient Hospital Stay (HOSPITAL_COMMUNITY): Payer: MEDICAID | Admitting: Speech Pathology

## 2012-03-17 DIAGNOSIS — S069X9A Unspecified intracranial injury with loss of consciousness of unspecified duration, initial encounter: Secondary | ICD-10-CM

## 2012-03-17 LAB — COMPREHENSIVE METABOLIC PANEL
ALT: 31 U/L (ref 0–35)
AST: 24 U/L (ref 0–37)
Albumin: 2.5 g/dL — ABNORMAL LOW (ref 3.5–5.2)
Alkaline Phosphatase: 41 U/L (ref 39–117)
BUN: 12 mg/dL (ref 6–23)
Chloride: 104 mEq/L (ref 96–112)
Potassium: 3.4 mEq/L — ABNORMAL LOW (ref 3.5–5.1)
Sodium: 142 mEq/L (ref 135–145)
Total Protein: 5.9 g/dL — ABNORMAL LOW (ref 6.0–8.3)

## 2012-03-17 LAB — CBC WITH DIFFERENTIAL/PLATELET
Basophils Absolute: 0 10*3/uL (ref 0.0–0.1)
Basophils Relative: 0 % (ref 0–1)
Eosinophils Absolute: 0.2 10*3/uL (ref 0.0–0.7)
MCH: 30.1 pg (ref 26.0–34.0)
MCHC: 33.6 g/dL (ref 30.0–36.0)
Neutro Abs: 3.4 10*3/uL (ref 1.7–7.7)
Neutrophils Relative %: 57 % (ref 43–77)
Platelets: 235 10*3/uL (ref 150–400)
RDW: 12.3 % (ref 11.5–15.5)
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
## 2012-03-17 NOTE — Progress Notes (Signed)
Physical Therapy Session Note  Patient Details  Name: Marie Cochran MRN: [id number] Date of Birth: Mar 30, 1991  AM Session: Today's Date: 03/17/2012 Time: 4098-1191 Time Calculation (min): 35 minutes  PM Session: Time:  1415-1445 Time Calculation (min): 30 minutes  Short Term Goals: Week 1:     Skilled Therapeutic Interventions/Progress Updates:    AM Session-Performed BERG.  Pt scored 52/56.  Ambulates >150 with supervision with no device.  Supervision for car transfer.  Supervision bed mobility. PM Session-Supervision bed mobility and transfers. High level balance activities in standing with min guard assist only to stoop to floor to retrieve objects.  Ambulates > 200 x 2 no device working on gait speed, head turns and starting/stopping.  Nustep with B LE's x 5 minutes at level 6.  Therapy Documentation Precautions:  Precautions Precautions: Fall;Shoulder Type of Shoulder Precautions: maintain left UE  immobilized Required Braces or Orthoses: Other Brace/Splint (left UE sling) Restrictions Weight Bearing Restrictions: Yes LUE Weight Bearing: Non weight bearing   Pain: Pain Assessment Pain Assessment: No/denies pain in am but c/o 9/10 pain in LUE and back in pm.  Repositioned and RN notified     Balance: Standardized Balance Assessment Standardized Balance Assessment: Berg Balance Test Berg Balance Test Sit to Stand: Able to stand without using hands and stabilize independently Standing Unsupported: Able to stand safely 2 minutes Sitting with Back Unsupported but Feet Supported on Floor or Stool: Able to sit safely and securely 2 minutes Stand to Sit: Sits safely with minimal use of hands Transfers: Able to transfer safely, minor use of hands Standing Unsupported with Eyes Closed: Able to stand 10 seconds safely Standing Ubsupported with Feet Together: Able to place feet together independently and stand 1 minute safely From Standing, Reach Forward with Outstretched Arm:  Can reach confidently >25 cm (10") From Standing Position, Pick up Object from Floor: Able to pick up shoe safely and easily From Standing Position, Turn to Look Behind Over each Shoulder: Looks behind from both sides and weight shifts well Turn 360 Degrees: Able to turn 360 degrees safely in 4 seconds or less Standing Unsupported, Alternately Place Feet on Step/Stool: Able to complete 4 steps without aid or supervision Standing Unsupported, One Foot in Front: Able to place foot tandem independently and hold 30 seconds Standing on One Leg: Able to lift leg independently and hold equal to or more than 3 seconds Total Score: 52     See FIM for current functional status  Therapy/Group: Individual Therapy  Newell Coral 03/17/2012, 2:52 PM

## 2012-03-17 NOTE — Progress Notes (Signed)
Patient information reviewed and entered into eRehab system by Adrieanna Boteler, RN, CRRN, PPS Coordinator.  Information including medical coding and functional independence measure will be reviewed and updated through discharge.    
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
## 2012-03-17 NOTE — Plan of Care (Signed)
Problem: RH BOWEL ELIMINATION Goal: RH STG MANAGE BOWEL WITH ASSISTANCE STG Manage Bowel with Assistance.  Outcome: Not Progressing Patients last BM 03/12/12 A. Averlee Swartz,LPN
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
## 2012-03-18 ENCOUNTER — Inpatient Hospital Stay (HOSPITAL_COMMUNITY): Payer: MEDICAID | Admitting: Speech Pathology

## 2012-03-18 ENCOUNTER — Inpatient Hospital Stay (HOSPITAL_COMMUNITY): Payer: MEDICAID | Admitting: Occupational Therapy

## 2012-03-18 ENCOUNTER — Inpatient Hospital Stay (HOSPITAL_COMMUNITY): Payer: MEDICAID | Admitting: Physical Therapy

## 2012-03-18 NOTE — Progress Notes (Signed)
The skilled treatment note has been reviewed and SLP is in agreement. Kiarra Md Smola, M.A., CCC-SLP [phone]  
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
## 2012-03-18 NOTE — Progress Notes (Signed)
Physical Therapy Note  Patient Details  Name: Marie Cochran MRN: [id number] Date of Birth: [birth year] Today's Date: 03/18/2012  Time: 1115-1155 40 minutes  No c/o pain.  Memory, path finding activity to gift shop with supervision/mod I for balance and gait in community setting.  Pt able to create list of 5 items she would find in gift shop and recall and find items with min A for recall.  Pt required min questioning cues for finding her room from the gift shop.  Standing balance with wii bowling game.  Pt able to attend to game x 15 minutes and participate with no LOB, no cuing for attention to task.  Overall improved activity tolerance and balance noted.  Individual therapy   [name] 03/18/2012, 4:11 PM

## 2012-03-18 NOTE — Progress Notes (Signed)
Speech Language Pathology Daily Session Note  Patient Details  Name: Marie Cochran MRN: [id number] Date of Birth: 04/28/1990  Today's Date: 03/18/2012 Time: 5621-3086 Time Calculation (min): 45 min  Short Term Goals: Week 1: SLP Short Term Goal 1 (Week 1): Patient will select attention for 15 minutes while completing various tasks with min assist vebal cue to redirect attention. SLP Short Term Goal 2 (Week 1): Patient will demonstrate carryover for daily information with min assist to utilize compensatory strategies. SLP Short Term Goal 3 (Week 1): Patient will demonstrate complex problem solving with min assist verbal cues. SLP Short Term Goal 4 (Week 1): Patient will demonstrate anticipatory awareness with mod assist verbal cues.  Skilled Therapeutic Interventions: Treatment addressed cognitive goals.  SLP facilitated the session with overall mod assist for recall of scheduled and PRN medications.  Patient selectively attended to moderately complex functional task for periods of approximately 10-15 minutes with supervision cues to redirect.  SLP also facilitated session with handout of scheduled and PRN medications to leave with patient to facilitate recall of daily information with medication management.    Patient reported baseline anxiety and had questions about prescription for xanax.  SLP initiated discussion about the need to ask for MD input whether xanax is appropriate.      FIM:  Comprehension Comprehension Mode: Auditory Comprehension: 5-Follows basic conversation/direction: With extra time/assistive device Expression Expression Mode: Verbal Expression: 5-Expresses basic needs/ideas: With extra time/assistive device Social Interaction Social Interaction: 5-Interacts appropriately 90% of the time - Needs monitoring or encouragement for participation or interaction. Problem Solving Problem Solving: 5-Solves basic 90% of the time/requires cueing < 10% of the time Memory Memory:  3-Recognizes or recalls 50 - 74% of the time/requires cueing 25 - 49% of the time FIM - Eating Eating Activity: 5: Supervision/cues;5: Set-up assist for open containers  Pain Pain Assessment Pain Assessment: No/denies pain  Therapy/Group: Individual Therapy  Jackalyn Lombard, Conrad Knollwood  Graduate Clinician Speech Language Pathology  Elston Aldape, Joni Reining 03/18/2012, 11:57 AM
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
## 2012-03-18 NOTE — Progress Notes (Signed)
Occupational Therapy Session Note  Patient Details  Name: FALLYN MUNNERLYN MRN: [id number] Date of Birth: 09-18-1990  Today's Date: 03/18/2012 Time: 1478-2956 and 1300-1330 Time Calculation (min)=65 and 30 total 95 min   Skilled Therapeutic Interventions/Progress Updates: Patient and mother wanted shower and wash hair.  This clinician assisted patient with shower but had to complete most of it except lower legs and feet in order to keep thoracic, L shoulder and L forearm dressings dry. Hair was not washed during session due to aforementioned.  Otherwise focus on dynamic balance and patient initation and participation in safe care.  PM functional ambulation  And dynamic balance for self care in patient room.  This clinician assisted patient with using dry shampoo cap as getting her hair clean was important today.     Therapy Documentation Precautions:  Precautions Precautions: Fall Type of Shoulder Precautions: L Shoulder to stay immobilized Required Braces or Orthoses: Other Brace/Splint (left UE sling) Other Brace/Splint: L UE sling Restrictions Weight Bearing Restrictions: Yes LUE Weight Bearing: Non weight bearing   Pain: 7/10  L shoulder; RN gave pain meds Pain Assessment Pain Assessment: No/denies pain See FIM for current functional status  Therapy/Group: Individual Therapy  Bud Face [facility] 03/18/2012, 2:43 PM

## 2012-03-19 ENCOUNTER — Inpatient Hospital Stay (HOSPITAL_COMMUNITY): Payer: MEDICAID

## 2012-03-19 MED ORDER — FLEET ENEMA 7-19 GM/118ML RE ENEM: 1.0000 | ENEMA | Freq: Once | RECTAL | Status: DC

## 2012-03-19 NOTE — Progress Notes (Signed)
Physical Therapy Session Note  Patient Details  Name: Marie Cochran MRN: [id number] Date of Birth: 12-24-1990  Today's Date: 03/19/2012 Time: 1100-1145 Time Calculation (min): 45 min  Short Term Goals: Week 1:     Skilled Therapeutic Interventions/Progress Updates:  Patient supine to sit with supervision. Patient required assistance with left arm sling. Patient ambulated 200+ feet on level surfaces with supervision. Patient worked on Editor, commissioning and weight shifting using Biodex balance system doing weight shift control side to side, anterior/posterior and diagonals. Patient also worked in all directions on random control and maze settings. Patient also played "catch game" on difficult setting. All was performed with right UE support. Patient ambulated up and down 12 steps modified independently with 1 rail. Patient exercised on NuStep x 8 minutes for LE strengthening on workload 6. Patient ambulated back to room with supervision.  Therapy Documentation Precautions:  Precautions Precautions: Fall Type of Shoulder Precautions: L Shoulder to stay immobilized Required Braces or Orthoses: Other Brace/Splint (left UE sling) Other Brace/Splint: L UE sling Restrictions Weight Bearing Restrictions: Yes LUE Weight Bearing: Non weight bearing  Pain: Pain Assessment Pain Assessment: No/denies pain  See FIM for current functional status  Therapy/Group: Individual Therapy  Arelia Longest M 03/19/2012, 1:05 PM
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
## 2012-03-19 NOTE — Progress Notes (Signed)
Chaplain made a follow-up visit to patient's room. Mother of patient was present during Chaplain's visit. Patient was responsive, alert but appeared to be tired from surgery. Chaplain shared words of comfort and encouragement with patient and family member. Chaplain gave a prayer shawl to patient as a gift from the Spiritual Care Department. Chaplain also provided ministry of presence and empathetically listened to family member's fear and concern. Chaplain will pass on to other Chaplain assigned to the Unit for a follow-up later as needed. Patient and family thanked Chaplain for visit, support and gift.

## 2012-03-20 ENCOUNTER — Inpatient Hospital Stay (HOSPITAL_COMMUNITY): Payer: MEDICAID | Admitting: Physical Therapy

## 2012-03-20 ENCOUNTER — Inpatient Hospital Stay (HOSPITAL_COMMUNITY): Payer: MEDICAID | Admitting: Speech Pathology

## 2012-03-20 ENCOUNTER — Encounter (HOSPITAL_COMMUNITY): Payer: Self-pay

## 2012-03-20 ENCOUNTER — Encounter (HOSPITAL_COMMUNITY): Payer: Self-pay | Admitting: Occupational Therapy

## 2012-03-20 DIAGNOSIS — S069X9A Unspecified intracranial injury with loss of consciousness of unspecified duration, initial encounter: Secondary | ICD-10-CM

## 2012-03-20 MED ORDER — MAGIC MOUTHWASH
10.0000 mL | Freq: Two times a day (BID) | ORAL | Status: DC
  Administered 2012-03-20: 10 mL via ORAL
  Filled 2012-03-20 (×5): qty 10

## 2012-03-20 MED ORDER — WHITE PETROLATUM GEL
Status: AC
  Filled 2012-03-20: qty 5

## 2012-03-20 NOTE — Progress Notes (Signed)
Speech Language Pathology Daily Session Note  Patient Details  Name: Marie Cochran MRN: [id number] Date of Birth: 18-Aug-1990  Today's Date: 03/20/2012 Time: 1000-1030 Time Calculation (min): 30 min  Short Term Goals: Week 1: SLP Short Term Goal 1 (Week 1): Patient will select attention for 15 minutes while completing various tasks with min assist vebal cue to redirect attention. SLP Short Term Goal 2 (Week 1): Patient will demonstrate carryover for daily information with min assist to utilize compensatory strategies. SLP Short Term Goal 3 (Week 1): Patient will demonstrate complex problem solving with min assist verbal cues. SLP Short Term Goal 4 (Week 1): Patient will demonstrate anticipatory awareness with mod assist verbal cues.  Skilled Therapeutic Interventions: Treatment focus on pt/family education with pt and her grandmother. Both given handouts and educated on current cognitive function and strategies to utilize at home to increase short-term memory, safety and independence. They were also educated on appropriate diet textures and foods to avoid while currently on a mechanical soft diet. Both verbalized and demonstrated understanding.    FIM:  Comprehension Comprehension Mode: Auditory Comprehension: 6-Follows complex conversation/direction: With extra time/assistive device Expression Expression Mode: Verbal Expression: 5-Expresses basic needs/ideas: With extra time/assistive device Social Interaction Social Interaction: 5-Interacts appropriately 90% of the time - Needs monitoring or encouragement for participation or interaction. Problem Solving Problem Solving: 5-Solves complex 90% of the time/cues < 10% of the time Memory Memory: 4-Recognizes or recalls 75 - 89% of the time/requires cueing 10 - 24% of the time FIM - Eating Eating Activity: 5: Supervision/cues  Pain Pain Assessment Pain Assessment: No/denies pain  Therapy/Group: Individual Therapy  Giavana Rooke,  Yafet Cline 03/20/2012, 10:41 AM

## 2012-03-20 NOTE — Progress Notes (Signed)
Physical Therapy Note  Patient Details  Name: Marie Cochran MRN: [id number] Date of Birth: 1990/11/14 Today's Date: 03/20/2012  1100-1150 (50 minutes) individual Pain: no reported pain Focus of treatment: Therapeutic activities focused on dynamic standing balance Treatment: Gait on unit > 500 feet modified independent; Wii bowling/tennis X 30 minutes in standing with no required instructional cues.    Kenedee Molesky,JIM 03/20/2012, 11:51 AM

## 2012-03-20 NOTE — Progress Notes (Signed)
Occupational Therapy Discharge Summary  Patient Details  Name: Marie Cochran MRN: [id number] Date of Birth: 07/12/1990  Today's Date: 03/20/2012 Time: 4098-1191 Time Calculation (min): 55 min  1:1 GRAD DAY- Pt declined shower today but did simulate transfer into bathtub. Did make a recommendation for supervision getting in and out of tub and to use a tub seat. Dressed with supervision to mod I. Wrote down instructions for pt and pt's family on how to provide care for left UE as it relates to ADLs (showering, dressing, sling care etc) as well as provided information about Mild TBI.    Patient has met 12 of 12 long term goals due to improved activity tolerance, improved balance, postural control, ability to compensate for deficits, improved attention and improved coordination.  Patient to discharge at overall supervision to mod I level.  Patient's care partner is independent to provide the necessary physical and cognitive assistance at discharge.  Pt will be d/c home with father and mother who will take turns providing care. Pt continues to demonstrate flat affect and behavior is consistent with Rancho Level VIII. Pt continues to show higher level cognitive deficits and would recommend neuro psych at this time and in the future OP f/u for shoulder once permitted mobility.  Reasons goals not met: N/A  Recommendation:  n/a  Equipment: No equipment provided  Reasons for discharge: treatment goals met and discharge from hospital  Patient/family agrees with progress made and goals achieved: Yes  OT Discharge Precautions/Restrictions  Precautions Precautions: Shoulder Type of Shoulder Precautions: L Shoulder to stay immobilized  Required Braces or Orthoses: Other Brace/Splint Other Brace/Splint: L UE sling Restrictions Weight Bearing Restrictions: Yes LUE Weight Bearing: Non weight bearing Pain  no c/o pain ADL  see FIM Vision/Perception  Vision - History Baseline Vision: No visual  deficits Patient Visual Report: No change from baseline Vision - Assessment Eye Alignment: Within Functional Limits Ocular Range of Motion: Within Functional Limits Saccades: Within functional limits Convergence: Within functional limits Perception Perception: Within Functional Limits Praxis Praxis: Intact  Cognition Overall Cognitive Status: Impaired Awareness: Impaired Awareness Impairment: Anticipatory impairment Comments: flat affect Sensation Sensation Light Touch: Appears Intact Stereognosis: Appears Intact Hot/Cold: Appears Intact Proprioception: Appears Intact Coordination Gross Motor Movements are Fluid and Coordinated: Yes Fine Motor Movements are Fluid and Coordinated: Yes Motor  Motor Motor: Within Functional Limits Mobility  Bed Mobility Rolling Right: 6: Modified independent (Device/Increase time) Right Sidelying to Sit: 6: Modified independent (Device/Increase time) Supine to Sit: 6: Modified independent (Device/Increase time) Sitting - Scoot to Edge of Bed: 6: Modified independent (Device/Increase time) Sit to Supine: 6: Modified independent (Device/Increase time) Transfers Sit to Stand: 6: Modified independent (Device/Increase time) Stand to Sit: 6: Modified independent (Device/Increase time)  Trunk/Postural Assessment  Cervical Assessment Cervical Assessment: Within Functional Limits Thoracic Assessment Thoracic Assessment: Within Functional Limits Lumbar Assessment Lumbar Assessment: Within Functional Limits Postural Control Postural Control: Within Functional Limits  Balance Static Standing Balance Static Standing - Level of Assistance: 6: Modified independent (Device/Increase time) Extremity/Trunk Assessment RUE Assessment RUE Assessment: Within Functional Limits LUE Assessment LUE Assessment: Exceptions to [facility] (due to no movement allowed)  See FIM for current functional status  Roney Mans [facility] 03/20/2012, 3:17 PM

## 2012-03-20 NOTE — Progress Notes (Signed)
Physical Therapy Note  Patient Details  Name: Marie Cochran MRN: [id number] Date of Birth: 1991/01/09 Today's Date: 03/20/2012  Time: 900-938 38 minutes  No c/o pain.  Treatment focused on dynamic gait and balance activities.  Pt able to perform dynamic gait with head turns, speed and direction changes >1000' mod I with pt able to self correct LOB.  Stair training x 4 flights with mod I using 1 handrail.  Standing balance on foam requires min A for tap ups and stepping up/down onto foam. Standing balance on trampoline with pt requiring min A for SLS, supervision for standing on B LEs with wt shifts and perturbations.  overall pt with much improved activity tolerance and balance since eval.  Individual therapy   DONAWERTH,KAREN 03/20/2012, 9:37 AM
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
## 2012-03-20 NOTE — Progress Notes (Signed)
Physical Therapy Discharge Summary  Patient Details  Name: Marie Cochran MRN: [id number] Date of Birth: July 01, 1990  Today's Date: 03/20/2012  Patient has met 8 of 8 long term goals due to improved activity tolerance, improved balance and ability to compensate for deficits.  Patient to discharge at an ambulatory level Modified Independent.   Pt with higher level balance deficits and will require supervision in community environments but is mod I will all ambulation, balance and mobility for home.  Reasons goals not met: n/a  Recommendation:  Patient will follow up with physician who will determine need for further services.  Equipment: no equipment required  Reasons for discharge: treatment goals met and discharge from hospital  Patient/family agrees with progress made and goals achieved: Yes  PT Discharge  Cognition Overall Cognitive Status: Impaired Awareness: Impaired Awareness Impairment: Anticipatory impairment Comments: flat affect Sensation Sensation Light Touch: Appears Intact Proprioception: Appears Intact Coordination Gross Motor Movements are Fluid and Coordinated: Yes Motor  Motor Motor: Within Functional Limits   Trunk/Postural Assessment  Cervical Assessment Cervical Assessment: Within Functional Limits Thoracic Assessment Thoracic Assessment: Within Functional Limits Lumbar Assessment Lumbar Assessment: Within Functional Limits Postural Control Postural Control: Within Functional Limits  Balance Static Standing Balance Static Standing - Level of Assistance: 6: Modified independent (Device/Increase time) 52/56 on BERG balance assessment Extremity Assessment      RLE Assessment RLE Assessment: Within Functional Limits LLE Assessment LLE Assessment: Within Functional Limits  See FIM for current functional status  Levon Boettcher 03/20/2012, 12:18 PM

## 2012-03-20 NOTE — Progress Notes (Signed)
Speech Language Pathology Discharge Summary  Patient Details  Name: Marie Cochran MRN: [id number] Date of Birth: 05-Jun-1990  Today's Date: 03/20/2012  Patient has met 4 of 4 Cochran term goals.  Patient to discharge at overall Supervision;Min level.   Reasons goals not met: N/A   Clinical Impression/Discharge Summary: Patient has met 4 of 4 Cochran term goals due to improved attention, awareness, problem solving and short-term memory. Patient to discharge at overall Min A-Supervision level. Patient's care partner is independent to provide the necessary physical and cognitive assistance at discharge. Pt will be d/c home with father and mother who will take turns providing care. Pt continues to demonstrate a flat affect and behavior is consistent with a Rancho Level VII. Pt continues to show higher level cognitive deficits and would recommend f/u neuro psych. The patient will also follow up with her physician who will determine needs for further services.    Care Partner:  Caregiver Able to Provide Assistance: Yes  Type of Caregiver Assistance: Physical;Cognitive  Recommendation:  24 hour supervision/assistance      Equipment: N/A   Reasons for discharge: Treatment goals met;Discharged from hospital   Patient/Family Agrees with Progress Made and Goals Achieved: Yes   See FIM for current functional status  Lujain Kraszewski 03/20/2012, 4:11 PM

## 2012-03-20 NOTE — Progress Notes (Signed)
Physical Therapy Session Note  Patient Details  Name: Marie Cochran MRN: [id number] Date of Birth: [birth year]  Today's Date: 03/20/2012 Time: 4098-1191 Time Calculation (min): 55 min  Short Term Goals: Week 1:     Skilled Therapeutic Interventions/Progress Updates:    Pt treatment focused on dynamic balance activities while following verbal and written instructions.  Pt participated in 55 minutes of activity with no LOB with 3 seated rest breaks.  Able to carry objects in R hand and ambulate Mod I.  Therapy Documentation Precautions:  Precautions Precautions: Fall Type of Shoulder Precautions: L Shoulder to stay immobilized Required Braces or Orthoses: Other Brace/Splint (left UE sling) Other Brace/Splint: L UE sling Restrictions Weight Bearing Restrictions: Yes LUE Weight Bearing: Non weight bearing    Pain: Pt premedicated prior to treatment. Does c/o L UE pain with increased activity but did not rate.  See FIM for current functional status  Therapy/Group: Individual Therapy  Sarina Ill, [place] 03/20/2012, 2:09 PM
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
## 2012-03-20 NOTE — Progress Notes (Signed)
Physical Therapy Session Note  Patient Details  Name: Marie Cochran MRN: [id number] Date of Birth: August 18, 1990  Today's Date: 03/20/2012 Time: 1030-1100 Time Calculation (min): 30 min  Short Term Goals: Week 1:     Skilled Therapeutic Interventions/Progress Updates:    Pt treatment focused on dynamic gait, balance activities, endurance and strengthening.  Ambulated >1000' with modi I (increased time) within hospital and outside hospital.  Pt able to ambulate up/down sloped sidewalk with no LOB.  Nustep x 5 minutes at level 8 with B LE's.  Standing on Bosu ball in parallel bars with minimal R UE support.  Squats on Bosu ball x 10 reps.  Therapy Documentation Precautions:  Precautions Precautions: Fall Type of Shoulder Precautions: L Shoulder to stay immobilized Required Braces or Orthoses: Other Brace/Splint (left UE sling) Other Brace/Splint: L UE sling Restrictions Weight Bearing Restrictions: Yes LUE Weight Bearing: Non weight bearing General:   Vital Signs:   Pain: Pain Assessment Pain Assessment: No/denies pain   Therapy/Group: Individual Therapy  Sarina Ill, PTA 03/20/2012, 11:20 AM

## 2012-03-20 NOTE — Discharge Summary (Signed)
  Discharge summary job # [id number]

## 2012-03-21 MED ORDER — HYDROCODONE-ACETAMINOPHEN 10-325 MG PO TABS: 2.0000 | ORAL_TABLET | ORAL | Status: DC

## 2012-03-21 MED ORDER — HYDROCODONE-ACETAMINOPHEN 10-325 MG PO TABS: 0.5000 | ORAL_TABLET | ORAL | Status: DC | PRN

## 2012-03-21 NOTE — Progress Notes (Signed)
Pt d/c'd to home accompanied by parents. VSS; c/o left shoulder pain, order obtained from Dan Angiulli PAC to give prn Norco 30 minutes early before transport.   Mother instructed on and observed chest wound packing; reviewed other wounds and abrasions. Sutures and staples d/c'd this am without incident. Pt in good spirits. Carlean Purl

## 2012-03-21 NOTE — Progress Notes (Signed)
Social Work  Discharge Note  The overall goal for the admission was met for:   Discharge location: Yes - home with parents sharing in providing 24/7 supervision  Length of Stay: Yes - 5 days  Discharge activity level: Yes - supervision overall  Home/community participation: Yes  Services provided included: MD, RD, PT, OT, SLP, RN, TR, Pharmacy, Neuropsych and SW  Financial Services: Other: NONE  Follow-up services arranged: Home Health: RN via [facility] and Patient/Family has no preference for HH/DME agencies  Comments (or additional information): Met with patient and mother to review mental health/ support recommendations per Dr. Jacquelyne Balint and team.  Recommendations are that pt follow up with [facility] for evaluation and treatment for Depression.  Also to follow up with Dr. Riley Kill to determine if appropriate to follow up further with neuropsych services and OPST.  Pt and mother provided with instruction on how to access [facility] and of local crisis team services available if needed at home.  Both very agreeable with all recommendations.  Patient/Family verbalized understanding of follow-up arrangements: Yes  Individual responsible for coordination of the follow-up plan: patient  Confirmed correct DME delivered: NA  Makella Buckingham
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
## 2012-03-29 ENCOUNTER — Ambulatory Visit (INDEPENDENT_AMBULATORY_CARE_PROVIDER_SITE_OTHER): Payer: No Typology Code available for payment source | Admitting: Family Medicine

## 2012-03-29 ENCOUNTER — Encounter: Payer: Self-pay | Admitting: Family Medicine

## 2012-03-29 DIAGNOSIS — S21109A Unspecified open wound of unspecified front wall of thorax without penetration into thoracic cavity, initial encounter: Secondary | ICD-10-CM

## 2012-03-29 DIAGNOSIS — S21119A Laceration without foreign body of unspecified front wall of thorax without penetration into thoracic cavity, initial encounter: Secondary | ICD-10-CM

## 2012-03-29 NOTE — Assessment & Plan Note (Signed)
Patient here for general follow up.  Most f/u will be by specialists since she is s/p surgery.  Discussed mood today, but they are leaning towards going to [facility].  Denies SI/HI.  Pt aware she can also come here for mood issues.

## 2012-03-29 NOTE — Patient Instructions (Signed)
It was good to see you today.  If you want to start a medicine for your mood, please feel free to come back.  You can also go to Behavioral health if you would prefer.  I will see you back as needed.
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
## 2012-03-29 NOTE — Assessment & Plan Note (Signed)
Per mom's request, was re-packed here in clinic today.

## 2012-04-04 ENCOUNTER — Ambulatory Visit (INDEPENDENT_AMBULATORY_CARE_PROVIDER_SITE_OTHER): Payer: Self-pay | Admitting: Family Medicine

## 2012-04-04 VITALS — BP 111/75 | HR 66 | Temp 98.1°F | Ht 64.0 in | Wt 139.0 lb

## 2012-04-04 DIAGNOSIS — S21109A Unspecified open wound of unspecified front wall of thorax without penetration into thoracic cavity, initial encounter: Secondary | ICD-10-CM

## 2012-04-04 DIAGNOSIS — S21119A Laceration without foreign body of unspecified front wall of thorax without penetration into thoracic cavity, initial encounter: Secondary | ICD-10-CM
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
## 2012-04-04 NOTE — Assessment & Plan Note (Signed)
Appears to be healing well, good granulation tissue, no communication with thorax.  Precepted with Dr Jennette Kettle who also inspected wound and agreed that it appears to be healing well, without infection. S/s of infection discussed again as well as other red flags to RTC for.

## 2012-04-06 ENCOUNTER — Encounter: Payer: Self-pay | Admitting: Family Medicine

## 2012-04-06 ENCOUNTER — Ambulatory Visit: Payer: Self-pay | Admitting: *Deleted

## 2012-04-11 ENCOUNTER — Ambulatory Visit: Payer: No Typology Code available for payment source | Attending: Family Medicine | Admitting: *Deleted

## 2012-04-11 DIAGNOSIS — M25639 Stiffness of unspecified wrist, not elsewhere classified: Secondary | ICD-10-CM | POA: Insufficient documentation

## 2012-04-11 DIAGNOSIS — IMO0001 Reserved for inherently not codable concepts without codable children: Secondary | ICD-10-CM | POA: Insufficient documentation

## 2012-04-11 DIAGNOSIS — M25649 Stiffness of unspecified hand, not elsewhere classified: Secondary | ICD-10-CM | POA: Insufficient documentation

## 2012-04-17 ENCOUNTER — Ambulatory Visit: Payer: No Typology Code available for payment source | Admitting: *Deleted

## 2012-04-21 ENCOUNTER — Encounter: Payer: No Typology Code available for payment source | Admitting: Physical Medicine & Rehabilitation

## 2012-05-02 ENCOUNTER — Encounter: Payer: Self-pay | Admitting: Family Medicine

## 2012-05-11 ENCOUNTER — Ambulatory Visit: Payer: No Typology Code available for payment source | Admitting: Rehabilitation

## 2012-05-12 ENCOUNTER — Ambulatory Visit: Payer: No Typology Code available for payment source | Admitting: Physical Therapy

## 2012-05-15 ENCOUNTER — Ambulatory Visit: Payer: No Typology Code available for payment source | Attending: Family Medicine | Admitting: Physical Therapy

## 2012-05-15 DIAGNOSIS — M25649 Stiffness of unspecified hand, not elsewhere classified: Secondary | ICD-10-CM | POA: Insufficient documentation

## 2012-05-15 DIAGNOSIS — M25639 Stiffness of unspecified wrist, not elsewhere classified: Secondary | ICD-10-CM | POA: Insufficient documentation

## 2012-05-15 DIAGNOSIS — IMO0001 Reserved for inherently not codable concepts without codable children: Secondary | ICD-10-CM | POA: Insufficient documentation

## 2012-05-16 ENCOUNTER — Encounter: Payer: Self-pay | Admitting: Physical Medicine & Rehabilitation

## 2012-05-16 ENCOUNTER — Encounter
Payer: No Typology Code available for payment source | Attending: Physical Medicine & Rehabilitation | Admitting: Physical Medicine & Rehabilitation

## 2012-05-16 ENCOUNTER — Telehealth: Payer: Self-pay | Admitting: Family Medicine

## 2012-05-16 VITALS — BP 120/76 | HR 88 | Resp 14 | Ht 64.0 in | Wt 146.8 lb

## 2012-05-16 DIAGNOSIS — S069XAA Unspecified intracranial injury with loss of consciousness status unknown, initial encounter: Secondary | ICD-10-CM | POA: Insufficient documentation

## 2012-05-16 DIAGNOSIS — S0181XA Laceration without foreign body of other part of head, initial encounter: Secondary | ICD-10-CM

## 2012-05-16 DIAGNOSIS — S42309B Unspecified fracture of shaft of humerus, unspecified arm, initial encounter for open fracture: Secondary | ICD-10-CM

## 2012-05-16 DIAGNOSIS — S42302B Unspecified fracture of shaft of humerus, left arm, initial encounter for open fracture: Secondary | ICD-10-CM

## 2012-05-16 DIAGNOSIS — F329 Major depressive disorder, single episode, unspecified: Secondary | ICD-10-CM | POA: Insufficient documentation

## 2012-05-16 DIAGNOSIS — S0180XA Unspecified open wound of other part of head, initial encounter: Secondary | ICD-10-CM | POA: Insufficient documentation

## 2012-05-16 DIAGNOSIS — F3289 Other specified depressive episodes: Secondary | ICD-10-CM | POA: Insufficient documentation

## 2012-05-16 DIAGNOSIS — S42309A Unspecified fracture of shaft of humerus, unspecified arm, initial encounter for closed fracture: Secondary | ICD-10-CM | POA: Insufficient documentation

## 2012-05-16 DIAGNOSIS — S069X9A Unspecified intracranial injury with loss of consciousness of unspecified duration, initial encounter: Secondary | ICD-10-CM

## 2012-05-16 DIAGNOSIS — F419 Anxiety disorder, unspecified: Secondary | ICD-10-CM

## 2012-05-16 DIAGNOSIS — S6390XA Sprain of unspecified part of unspecified wrist and hand, initial encounter: Secondary | ICD-10-CM | POA: Insufficient documentation

## 2012-05-16 DIAGNOSIS — F411 Generalized anxiety disorder: Secondary | ICD-10-CM

## 2012-05-16 DIAGNOSIS — G4700 Insomnia, unspecified: Secondary | ICD-10-CM | POA: Insufficient documentation

## 2012-05-16 MED ORDER — TRAZODONE HCL 50 MG PO TABS: 50.0000 mg | ORAL_TABLET | Freq: Every day | ORAL | Status: DC

## 2012-05-16 NOTE — Telephone Encounter (Signed)
Called mom back, appointment was made for 05/22/12 with Dr Fara Boros.Busick, Rodena Medin
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
## 2012-05-16 NOTE — Patient Instructions (Signed)
Avoid sunlight. Continue to use the skin care products you mentioned  YOU NEED TO WORK ON LEFT SHOULDER RANGE OF MOTION AT HOME EVERY DAY!!!!
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
## 2012-05-17 ENCOUNTER — Ambulatory Visit: Payer: No Typology Code available for payment source | Admitting: Physical Therapy

## 2012-05-19 ENCOUNTER — Ambulatory Visit: Payer: No Typology Code available for payment source | Admitting: Physical Therapy

## 2012-05-22 ENCOUNTER — Ambulatory Visit: Payer: No Typology Code available for payment source | Admitting: Physical Therapy

## 2012-05-22 ENCOUNTER — Ambulatory Visit: Payer: Self-pay | Admitting: Family Medicine

## 2012-05-24 ENCOUNTER — Ambulatory Visit: Payer: No Typology Code available for payment source | Admitting: Physical Therapy

## 2012-05-26 ENCOUNTER — Ambulatory Visit: Payer: No Typology Code available for payment source | Admitting: Physical Therapy

## 2012-05-30 ENCOUNTER — Ambulatory Visit: Payer: No Typology Code available for payment source | Attending: Family Medicine | Admitting: Physical Therapy

## 2012-05-30 DIAGNOSIS — M25649 Stiffness of unspecified hand, not elsewhere classified: Secondary | ICD-10-CM | POA: Insufficient documentation

## 2012-05-30 DIAGNOSIS — IMO0001 Reserved for inherently not codable concepts without codable children: Secondary | ICD-10-CM | POA: Insufficient documentation

## 2012-05-30 DIAGNOSIS — M25639 Stiffness of unspecified wrist, not elsewhere classified: Secondary | ICD-10-CM | POA: Insufficient documentation

## 2012-05-31 ENCOUNTER — Ambulatory Visit (INDEPENDENT_AMBULATORY_CARE_PROVIDER_SITE_OTHER): Payer: No Typology Code available for payment source | Admitting: Family Medicine

## 2012-05-31 ENCOUNTER — Encounter: Payer: Self-pay | Admitting: Family Medicine

## 2012-05-31 ENCOUNTER — Ambulatory Visit: Payer: No Typology Code available for payment source | Admitting: Physical Therapy

## 2012-05-31 VITALS — BP 115/80 | HR 73 | Ht 64.0 in | Wt 144.0 lb

## 2012-05-31 DIAGNOSIS — S025XXA Fracture of tooth (traumatic), initial encounter for closed fracture: Secondary | ICD-10-CM

## 2012-05-31 DIAGNOSIS — S0180XA Unspecified open wound of other part of head, initial encounter: Secondary | ICD-10-CM

## 2012-05-31 DIAGNOSIS — F419 Anxiety disorder, unspecified: Secondary | ICD-10-CM

## 2012-05-31 DIAGNOSIS — S0181XA Laceration without foreign body of other part of head, initial encounter: Secondary | ICD-10-CM

## 2012-05-31 DIAGNOSIS — G4700 Insomnia, unspecified: Secondary | ICD-10-CM

## 2012-05-31 DIAGNOSIS — F411 Generalized anxiety disorder: Secondary | ICD-10-CM

## 2012-05-31 NOTE — Assessment & Plan Note (Signed)
Trazodone seems to be helping some with anxiety, will increase for sleep.  Will discuss counseling with patient if she returns for additional assistance as this may be important for her long-term well-being.
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
## 2012-05-31 NOTE — Assessment & Plan Note (Signed)
Started on Trazodone by Dr. Riley Kill- helping anxiety some but not sleep, will increase to 150mg -200mg  qhs to see if we can get some added sleep help. Per Dr. Riley Kill, Would like to avoid benzos if possible which I agree with.

## 2012-05-31 NOTE — Patient Instructions (Addendum)
It was good to see you today.   I'm glad you feel like your shoulder is improving!  The scars on your face are starting to get better as well.  I put in a referral to get you to the dentist-- they will contact you.  You can try increasing the trazodone to 3 tabs at night (maximum would be 4= 200mg ).  If you are still having trouble sleeping, come back and we will see if there is something else we can try.  Make sure to keep the appointment with Dr. Riley Kill on 4/23.

## 2012-05-31 NOTE — Assessment & Plan Note (Signed)
No acute issues, will place general/non-emergent dental referral.

## 2012-06-02 ENCOUNTER — Ambulatory Visit: Payer: No Typology Code available for payment source | Admitting: Physical Therapy

## 2012-06-06 ENCOUNTER — Ambulatory Visit: Payer: No Typology Code available for payment source | Admitting: Physical Therapy

## 2012-06-07 ENCOUNTER — Ambulatory Visit: Payer: No Typology Code available for payment source | Admitting: Physical Therapy

## 2012-06-09 ENCOUNTER — Ambulatory Visit: Payer: No Typology Code available for payment source | Admitting: Physical Therapy

## 2012-06-13 ENCOUNTER — Ambulatory Visit: Payer: No Typology Code available for payment source | Admitting: Physical Therapy

## 2012-06-14 ENCOUNTER — Ambulatory Visit: Payer: No Typology Code available for payment source | Admitting: Physical Therapy

## 2012-06-16 ENCOUNTER — Ambulatory Visit: Payer: No Typology Code available for payment source | Admitting: Physical Therapy

## 2012-06-20 ENCOUNTER — Ambulatory Visit: Payer: No Typology Code available for payment source | Admitting: Physical Therapy

## 2012-06-21 ENCOUNTER — Ambulatory Visit: Payer: No Typology Code available for payment source | Admitting: Physical Therapy

## 2012-06-23 ENCOUNTER — Ambulatory Visit: Payer: No Typology Code available for payment source | Admitting: Physical Therapy

## 2012-06-27 ENCOUNTER — Ambulatory Visit: Payer: No Typology Code available for payment source | Attending: Orthopedic Surgery | Admitting: Physical Therapy

## 2012-06-27 DIAGNOSIS — M25619 Stiffness of unspecified shoulder, not elsewhere classified: Secondary | ICD-10-CM | POA: Insufficient documentation

## 2012-06-27 DIAGNOSIS — M25519 Pain in unspecified shoulder: Secondary | ICD-10-CM | POA: Insufficient documentation

## 2012-06-27 DIAGNOSIS — IMO0001 Reserved for inherently not codable concepts without codable children: Secondary | ICD-10-CM | POA: Insufficient documentation

## 2012-06-29 ENCOUNTER — Ambulatory Visit: Payer: No Typology Code available for payment source | Admitting: Physical Therapy

## 2012-06-30 ENCOUNTER — Ambulatory Visit: Payer: No Typology Code available for payment source | Admitting: Physical Therapy

## 2012-07-04 ENCOUNTER — Ambulatory Visit: Payer: No Typology Code available for payment source | Admitting: Physical Therapy

## 2012-07-05 ENCOUNTER — Ambulatory Visit: Payer: No Typology Code available for payment source | Admitting: Physical Therapy

## 2012-07-07 ENCOUNTER — Ambulatory Visit: Payer: No Typology Code available for payment source | Admitting: Physical Therapy

## 2012-07-11 ENCOUNTER — Ambulatory Visit: Payer: No Typology Code available for payment source | Attending: Orthopedic Surgery | Admitting: Physical Therapy

## 2012-07-11 DIAGNOSIS — M25519 Pain in unspecified shoulder: Secondary | ICD-10-CM | POA: Insufficient documentation

## 2012-07-11 DIAGNOSIS — M25619 Stiffness of unspecified shoulder, not elsewhere classified: Secondary | ICD-10-CM | POA: Insufficient documentation

## 2012-07-11 DIAGNOSIS — IMO0001 Reserved for inherently not codable concepts without codable children: Secondary | ICD-10-CM | POA: Insufficient documentation

## 2012-07-12 ENCOUNTER — Ambulatory Visit: Payer: No Typology Code available for payment source | Admitting: Physical Therapy

## 2012-07-18 ENCOUNTER — Ambulatory Visit: Payer: No Typology Code available for payment source | Admitting: Physical Therapy

## 2012-07-19 ENCOUNTER — Ambulatory Visit: Payer: No Typology Code available for payment source | Admitting: Physical Therapy

## 2012-07-21 ENCOUNTER — Ambulatory Visit: Payer: No Typology Code available for payment source | Admitting: Physical Therapy

## 2012-07-26 ENCOUNTER — Ambulatory Visit: Payer: No Typology Code available for payment source | Attending: Orthopedic Surgery | Admitting: Physical Therapy

## 2012-07-26 DIAGNOSIS — M25519 Pain in unspecified shoulder: Secondary | ICD-10-CM | POA: Insufficient documentation

## 2012-07-26 DIAGNOSIS — M25619 Stiffness of unspecified shoulder, not elsewhere classified: Secondary | ICD-10-CM | POA: Insufficient documentation

## 2012-07-26 DIAGNOSIS — IMO0001 Reserved for inherently not codable concepts without codable children: Secondary | ICD-10-CM | POA: Insufficient documentation

## 2012-07-28 ENCOUNTER — Ambulatory Visit: Payer: No Typology Code available for payment source | Admitting: Physical Therapy

## 2012-07-31 ENCOUNTER — Ambulatory Visit: Payer: No Typology Code available for payment source | Admitting: Physical Therapy

## 2012-08-02 ENCOUNTER — Ambulatory Visit: Payer: No Typology Code available for payment source | Admitting: Physical Therapy

## 2012-08-03 ENCOUNTER — Encounter: Payer: Self-pay | Admitting: Family Medicine

## 2012-08-03 ENCOUNTER — Ambulatory Visit (INDEPENDENT_AMBULATORY_CARE_PROVIDER_SITE_OTHER): Payer: No Typology Code available for payment source | Admitting: Family Medicine

## 2012-08-03 VITALS — BP 123/82 | HR 92 | Ht 64.0 in | Wt 148.0 lb

## 2012-08-03 DIAGNOSIS — Z309 Encounter for contraceptive management, unspecified: Secondary | ICD-10-CM

## 2012-08-03 DIAGNOSIS — IMO0001 Reserved for inherently not codable concepts without codable children: Secondary | ICD-10-CM

## 2012-08-03 DIAGNOSIS — Z3009 Encounter for other general counseling and advice on contraception: Secondary | ICD-10-CM

## 2012-08-03 DIAGNOSIS — A6000 Herpesviral infection of urogenital system, unspecified: Secondary | ICD-10-CM

## 2012-08-03 LAB — POCT URINE PREGNANCY: Preg Test, Ur: NEGATIVE

## 2012-08-03 MED ORDER — NORGESTIMATE-ETH ESTRADIOL 0.25-35 MG-MCG PO TABS: 1.0000 | ORAL_TABLET | Freq: Every day | ORAL | Status: DC

## 2012-08-03 MED ORDER — ACYCLOVIR 200 MG PO CAPS: 400.0000 mg | ORAL_CAPSULE | Freq: Three times a day (TID) | ORAL | Status: DC

## 2012-08-03 NOTE — Patient Instructions (Addendum)
It was good to see you today.  The Sprintec is the birth control pill.  It is $9 at [employer].  You can take it to the Health Department and see if they carry it.  If they can get you a different birth control pill for the same price or cheaper, let me know and I will send them a prescription for a different one.  I am giving you a "just in case" prescription for your herpes.  If you start to feel like you are getting an outbreak, start taking the medicine.  This is on the $4 list at [facility], or you can take it to the health department.  If this one is too expensive, ask the Health Department what they can get for you, and I will send in a different prescription. You also have the ability to take an every day, suppressive, dose of the medicine.  Again, find out which anti-viral medication they can get for you for cheap, and I will send it in. Alternatively, we can wait to see if/when you have another outbreak and then decide if you need suppressive treatment.  If you have swelling in one leg or sudden shortness of breath, stop the birth control pill and go to the ER for evaluation.  Come back to see me as needed.  If you don't like the pill you are on, come in, and we can see if there are other options.

## 2012-08-03 NOTE — Progress Notes (Signed)
S: Pt comes in today for discussion of birth control.  Is currently sexually active with 1 female partner.  Using condoms as contraception-- uses them regularly- every time.  She is not concerned about STIs but does have known diagnosis of genital herpes.  Was previously Rx'ed suppressive therapy bu her former GYN when she had medicaid, but has not been taking it in >1 year due to losing insurance. Has only had 1 outbreak in the past year, ~6 months ago.  Her mom does not know about this diagnosis.  She has never been on birth control before.  She is a non-smoker.  She has no history of blood clot.  She does not have migraines.  She has never been pregnant.  Her periods are regular, without significant cramping or heavy bleeding. She does not want Depo (doesn't like needles), Nexplanon (needles and doesn't want something that long term), or IUD (doesn't want something that long, and sister just had one that had to get taken out by a specialist since they couldn't find her strings).  Is most interested in the pill.  Does not think she wants NuvaRing or the patch.   ROS: Per HPI  History  Smoking status  . Never Smoker   Smokeless tobacco  . Never Used    O:  Filed Vitals:   08/03/12 1353  BP: 123/82  Pulse: 92    Gen: NAD, pleasant   A/P: 22 y.o. female p/w need for birth control, HSV -See problem list -f/u in PRN

## 2012-08-03 NOTE — Assessment & Plan Note (Signed)
Discussed all of the options, including OCPs, patch, NuvaRing, IUD, Nexplanon, Depo.  Pt would like to try OCPs. Given Rx for Sprintec since affordable at [facility]-- she will also check with [facility] to see if it is more affordable there.  Aware she should also be using condoms due to her HSV and to especially use them as back up if she is late taking or misses a dose. No tobacco use, no h/o migraines, no h/o blood clot.  Not concerned about STIs.

## 2012-08-03 NOTE — Assessment & Plan Note (Addendum)
Diagnosed by form OB/Gyn, Dr. Parke Simmers.  Given Rx in case of outbreak with treatment dose of  Acyclovir 400mg  TID or 200mg  5x/day.  Suppressive would be 400 mg twice daily, but we will wait to see if/when pt has another outbreak-- has been 6 months and has not been on suppressive therapy in over 1 year.

## 2012-08-04 ENCOUNTER — Ambulatory Visit: Payer: No Typology Code available for payment source | Admitting: Physical Therapy

## 2012-08-08 ENCOUNTER — Ambulatory Visit: Payer: No Typology Code available for payment source | Admitting: Physical Therapy

## 2012-08-09 ENCOUNTER — Ambulatory Visit: Payer: Self-pay | Admitting: Physical Medicine & Rehabilitation

## 2012-08-14 ENCOUNTER — Ambulatory Visit: Payer: No Typology Code available for payment source | Attending: Orthopedic Surgery | Admitting: Physical Therapy

## 2012-08-14 DIAGNOSIS — IMO0001 Reserved for inherently not codable concepts without codable children: Secondary | ICD-10-CM | POA: Insufficient documentation

## 2012-08-14 DIAGNOSIS — M25519 Pain in unspecified shoulder: Secondary | ICD-10-CM | POA: Insufficient documentation

## 2012-08-14 DIAGNOSIS — M25619 Stiffness of unspecified shoulder, not elsewhere classified: Secondary | ICD-10-CM | POA: Insufficient documentation

## 2012-08-16 ENCOUNTER — Ambulatory Visit: Payer: No Typology Code available for payment source | Admitting: Physical Therapy

## 2012-08-16 ENCOUNTER — Encounter: Payer: No Typology Code available for payment source | Admitting: Physical Medicine & Rehabilitation

## 2012-08-18 ENCOUNTER — Ambulatory Visit: Payer: No Typology Code available for payment source | Admitting: Physical Therapy

## 2012-08-22 ENCOUNTER — Encounter
Payer: No Typology Code available for payment source | Attending: Physical Medicine & Rehabilitation | Admitting: Physical Medicine & Rehabilitation

## 2012-08-23 ENCOUNTER — Ambulatory Visit: Payer: No Typology Code available for payment source | Attending: Orthopedic Surgery | Admitting: Physical Therapy

## 2012-08-23 DIAGNOSIS — M25519 Pain in unspecified shoulder: Secondary | ICD-10-CM | POA: Insufficient documentation

## 2012-08-23 DIAGNOSIS — M25619 Stiffness of unspecified shoulder, not elsewhere classified: Secondary | ICD-10-CM | POA: Insufficient documentation

## 2012-08-23 DIAGNOSIS — IMO0001 Reserved for inherently not codable concepts without codable children: Secondary | ICD-10-CM | POA: Insufficient documentation

## 2012-08-24 ENCOUNTER — Ambulatory Visit: Payer: No Typology Code available for payment source | Attending: Orthopedic Surgery | Admitting: Physical Therapy

## 2012-08-24 DIAGNOSIS — IMO0001 Reserved for inherently not codable concepts without codable children: Secondary | ICD-10-CM | POA: Insufficient documentation

## 2012-08-24 DIAGNOSIS — M25519 Pain in unspecified shoulder: Secondary | ICD-10-CM | POA: Insufficient documentation

## 2012-08-24 DIAGNOSIS — M25619 Stiffness of unspecified shoulder, not elsewhere classified: Secondary | ICD-10-CM | POA: Insufficient documentation

## 2012-08-29 ENCOUNTER — Ambulatory Visit: Payer: No Typology Code available for payment source | Admitting: Physical Therapy

## 2012-08-31 ENCOUNTER — Ambulatory Visit: Payer: No Typology Code available for payment source | Attending: Orthopedic Surgery | Admitting: Physical Therapy

## 2012-08-31 DIAGNOSIS — M25619 Stiffness of unspecified shoulder, not elsewhere classified: Secondary | ICD-10-CM | POA: Insufficient documentation

## 2012-08-31 DIAGNOSIS — M25519 Pain in unspecified shoulder: Secondary | ICD-10-CM | POA: Insufficient documentation

## 2012-08-31 DIAGNOSIS — IMO0001 Reserved for inherently not codable concepts without codable children: Secondary | ICD-10-CM | POA: Insufficient documentation

## 2012-09-01 ENCOUNTER — Ambulatory Visit: Payer: No Typology Code available for payment source | Admitting: Physical Therapy

## 2012-09-04 ENCOUNTER — Ambulatory Visit: Payer: No Typology Code available for payment source | Attending: Orthopedic Surgery | Admitting: Physical Therapy

## 2012-09-04 DIAGNOSIS — IMO0001 Reserved for inherently not codable concepts without codable children: Secondary | ICD-10-CM | POA: Insufficient documentation

## 2012-09-04 DIAGNOSIS — M25619 Stiffness of unspecified shoulder, not elsewhere classified: Secondary | ICD-10-CM | POA: Insufficient documentation

## 2012-09-04 DIAGNOSIS — M25519 Pain in unspecified shoulder: Secondary | ICD-10-CM | POA: Insufficient documentation

## 2012-09-06 ENCOUNTER — Ambulatory Visit: Payer: No Typology Code available for payment source | Attending: Orthopedic Surgery | Admitting: Physical Therapy

## 2012-09-06 DIAGNOSIS — M25619 Stiffness of unspecified shoulder, not elsewhere classified: Secondary | ICD-10-CM | POA: Insufficient documentation

## 2012-09-06 DIAGNOSIS — M25519 Pain in unspecified shoulder: Secondary | ICD-10-CM | POA: Insufficient documentation

## 2012-09-06 DIAGNOSIS — IMO0001 Reserved for inherently not codable concepts without codable children: Secondary | ICD-10-CM | POA: Insufficient documentation

## 2012-09-08 ENCOUNTER — Ambulatory Visit: Payer: No Typology Code available for payment source | Attending: Orthopedic Surgery | Admitting: Physical Therapy

## 2012-09-08 DIAGNOSIS — M25519 Pain in unspecified shoulder: Secondary | ICD-10-CM | POA: Insufficient documentation

## 2012-09-08 DIAGNOSIS — IMO0001 Reserved for inherently not codable concepts without codable children: Secondary | ICD-10-CM | POA: Insufficient documentation

## 2012-09-08 DIAGNOSIS — M25619 Stiffness of unspecified shoulder, not elsewhere classified: Secondary | ICD-10-CM | POA: Insufficient documentation

## 2013-01-13 ENCOUNTER — Encounter (HOSPITAL_BASED_OUTPATIENT_CLINIC_OR_DEPARTMENT_OTHER): Payer: Self-pay

## 2013-01-13 ENCOUNTER — Emergency Department (HOSPITAL_BASED_OUTPATIENT_CLINIC_OR_DEPARTMENT_OTHER)
Admission: EM | Admit: 2013-01-13 | Discharge: 2013-01-13 | Disposition: A | Discharge status: Home / Self Care | Payer: No Typology Code available for payment source | Attending: Emergency Medicine | Admitting: Emergency Medicine

## 2013-01-13 DIAGNOSIS — Z8659 Personal history of other mental and behavioral disorders: Secondary | ICD-10-CM | POA: Insufficient documentation

## 2013-01-13 DIAGNOSIS — B86 Scabies: Secondary | ICD-10-CM

## 2013-01-13 DIAGNOSIS — Z8669 Personal history of other diseases of the nervous system and sense organs: Secondary | ICD-10-CM | POA: Insufficient documentation

## 2013-01-13 MED ORDER — CYCLOBENZAPRINE HCL 10 MG PO TABS: 10.0000 mg | ORAL_TABLET | Freq: Two times a day (BID) | ORAL | Status: DC | PRN

## 2013-01-13 MED ORDER — PERMETHRIN 5 % EX CREA: TOPICAL_CREAM | CUTANEOUS | Status: DC

## 2013-01-13 NOTE — ED Provider Notes (Signed)
CSN: [id number]     Arrival date & time 01/13/13  1208 History   First MD Initiated Contact with Patient 01/13/13 1233     Chief Complaint  Patient presents with  . Rash   (Consider location/radiation/quality/duration/timing/severity/associated sxs/prior Treatment) Patient is a 22 y.o. female presenting with rash. The history is provided by the patient. No language interpreter was used.  Rash Location:  Full body Quality: itchiness   Severity:  Mild Onset quality:  Sudden Timing:  Constant Progression:  Worsening Chronicity:  New Relieved by:  Nothing Ineffective treatments:  None tried Pt complains of itching x 3 weeks.  Pt exposed to scabies.  Pt reports muscle soreness low back from moving  Past Medical History  Diagnosis Date  . Anxiety   . Migraines     ?due to needing glasses  . No pertinent past medical history    Past Surgical History  Procedure Laterality Date  . Orif humerus fracture  03/13/2012    Procedure: OPEN REDUCTION INTERNAL FIXATION (ORIF) PROXIMAL HUMERUS FRACTURE;  Surgeon: Mable Paris, MD;  Location: [facility] OR;  Service: Orthopedics;  Laterality: Left;  Irrigation and debridement done at 2058.  Marland Kitchen Tendon repair  03/13/2012    Procedure: TENDON REPAIR;  Surgeon: Mable Paris, MD;  Location: [facility] OR;  Service: Orthopedics;;  start 2123  . Brain surgery     Family History  Problem Relation Age of Onset  . Depression Sister   . Diabetes     History  Substance Use Topics  . Smoking status: Never Smoker   . Smokeless tobacco: Never Used  . Alcohol Use: Yes     Comment: rare   OB History   Grav Para Term Preterm Abortions TAB SAB Ect Mult Living   0              Review of Systems  Skin: Positive for rash.  All other systems reviewed and are negative.    Allergies  Review of patient's allergies indicates no known allergies.  Home Medications   Current Outpatient Rx  Name  Route  Sig  Dispense  Refill  . cyclobenzaprine  (FLEXERIL) 10 MG tablet   Oral   Take 1 tablet (10 mg total) by mouth 2 (two) times daily as needed for muscle spasms.   20 tablet   0   . permethrin (ELIMITE) 5 % cream      Apply to affected area once   60 g   1    BP 118/75  Pulse 89  Temp(Src) 99.1 F (37.3 C) (Oral)  Resp 16  Ht 5\' 4"  (1.626 m)  Wt 140 lb (63.504 kg)  BMI 24.02 kg/m2  SpO2 100% Physical Exam  Nursing note and vitals reviewed. Constitutional: She is oriented to person, place, and time. She appears well-developed and well-nourished.  HENT:  Head: Normocephalic.  Eyes: Pupils are equal, round, and reactive to light.  Neck: Normal range of motion.  Cardiovascular: Normal rate.   Pulmonary/Chest: Effort normal.  Musculoskeletal:  ls spine nontender,  Tender lumbar area  Neurological: She is alert and oriented to person, place, and time. She has normal reflexes.  Skin: Rash noted.  Psychiatric: She has a normal mood and affect.    ED Course  Procedures (including critical care time) Labs Review Labs Reviewed - No data to display Imaging Review No results found.  MDM   1. Scabies    Elemite,  And flexeril for muscle soreness    Verlon Au  Verline Lema, PA-C 01/13/13 2347  Elson Areas, PA-C 01/13/13 [phone]
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
## 2013-01-14 NOTE — ED Provider Notes (Signed)
Medical screening examination/treatment/procedure(s) were performed by non-physician practitioner and as supervising physician I was immediately available for consultation/collaboration.   Junius Argyle, MD 01/14/13 2134

## 2013-10-06 ENCOUNTER — Encounter (HOSPITAL_COMMUNITY): Payer: Self-pay | Admitting: Emergency Medicine

## 2013-10-06 ENCOUNTER — Emergency Department (HOSPITAL_COMMUNITY)
Admission: EM | Admit: 2013-10-06 | Discharge: 2013-10-06 | Disposition: A | Discharge status: Home / Self Care | Payer: No Typology Code available for payment source | Attending: Emergency Medicine | Admitting: Emergency Medicine

## 2013-10-06 DIAGNOSIS — Z3202 Encounter for pregnancy test, result negative: Secondary | ICD-10-CM | POA: Insufficient documentation

## 2013-10-06 DIAGNOSIS — Z8659 Personal history of other mental and behavioral disorders: Secondary | ICD-10-CM | POA: Insufficient documentation

## 2013-10-06 DIAGNOSIS — N926 Irregular menstruation, unspecified: Secondary | ICD-10-CM | POA: Insufficient documentation

## 2013-10-06 DIAGNOSIS — Z8679 Personal history of other diseases of the circulatory system: Secondary | ICD-10-CM | POA: Insufficient documentation

## 2013-10-06 LAB — URINALYSIS, ROUTINE W REFLEX MICROSCOPIC
Bilirubin Urine: NEGATIVE
GLUCOSE, UA: NEGATIVE mg/dL
HGB URINE DIPSTICK: NEGATIVE
Ketones, ur: NEGATIVE mg/dL
LEUKOCYTES UA: NEGATIVE
Nitrite: NEGATIVE
PROTEIN: NEGATIVE mg/dL
SPECIFIC GRAVITY, URINE: 1.024 (ref 1.005–1.030)
Urobilinogen, UA: 1 mg/dL (ref 0.0–1.0)
pH: 6 (ref 5.0–8.0)

## 2013-10-06 LAB — POC URINE PREG, ED: PREG TEST UR: NEGATIVE

## 2013-10-06 NOTE — ED Notes (Signed)
Pt. Stated, I 've not had a period since Feb. 4, 2015, and I've taken numerous pregnancy test and its neg. i just wan to make sure.

## 2013-10-06 NOTE — ED Notes (Signed)
In recent times I've been having stomach pains , no bleeding or anything else.

## 2013-10-06 NOTE — ED Notes (Signed)
Education provided about pregnancy prevention, menstruation and importance of following up with OBGYN.

## 2013-10-06 NOTE — ED Provider Notes (Signed)
CSN: [id number]     Arrival date & time 10/06/13  1302 History   None    Chief Complaint  Patient presents with  . Abdominal Pain   HPI  Marie Cochran is a 23 y.o. female with a PMH of anxiety, migraines who presents to the ED for evaluation of abdominal pain. History was provided by the patient. Patient states she has had intermittent abdominal pain for the past two weeks but denies this currently. She states that she has not had a menstrual period since February. She states that her menstrual periods are usually irregular, but she has not had an absent menstrual period for this long. She is currently sexually active and is not on any birth control. She states she took multiple pregnancy tests at home but they were negative. She came to the ED today "just to make sure." She has an appointment with Women's Health in the next few weeks. She states she has had intermittent middle lower abdominal cramping off and on "like I am going to get my period" but she has no pain currently. No dysuria, hematuria, constipation, diarrhea, vaginal discharge, vaginal bleeding/spotting, genital sores, dyspareunia, fever, chills, change in appetite/activity, or other concerns. Patient states she has been very stressed lately, which may be why she has not had a menstrual period.    Past Medical History  Diagnosis Date  . Anxiety   . Migraines     ?due to needing glasses  . No pertinent past medical history    Past Surgical History  Procedure Laterality Date  . Orif humerus fracture  03/13/2012    Procedure: OPEN REDUCTION INTERNAL FIXATION (ORIF) PROXIMAL HUMERUS FRACTURE;  Surgeon: Mable ParisJustin William Chandler, MD;  [facility];  Service: Orthopedics;  Laterality: Left;  Irrigation and debridement done at 2058.  Marland Kitchen. Tendon repair  03/13/2012    Procedure: TENDON REPAIR;  Surgeon: Mable ParisJustin William Chandler, MD;  [facility];  Service: Orthopedics;;  start 2123  . Brain surgery     Family History  Problem  Relation Age of Onset  . Depression Sister   . Diabetes     History  Substance Use Topics  . Smoking status: Never Smoker   . Smokeless tobacco: Never Used  . Alcohol Use: Yes     Comment: rare   OB History   Grav Para Term Preterm Abortions TAB SAB Ect Mult Living   0               Review of Systems  Constitutional: Negative for fever, chills, activity change, appetite change and fatigue.  Gastrointestinal: Positive for abdominal pain (intermittent). Negative for nausea, vomiting, diarrhea and constipation.  Genitourinary: Negative for dysuria, urgency, frequency, hematuria, decreased urine volume, vaginal bleeding, vaginal discharge, difficulty urinating, genital sores, vaginal pain, menstrual problem, pelvic pain and dyspareunia.  Musculoskeletal: Negative for back pain and myalgias.  Neurological: Negative for dizziness, weakness, light-headedness and headaches.    Allergies  Review of patient's allergies indicates no known allergies.  Home Medications   Prior to Admission medications   Not on File   BP 108/67  Pulse 65  Temp(Src) 98.5 F (36.9 C) (Oral)  Resp 21  SpO2 100%  LMP 05/30/2013  Filed Vitals:   10/06/13 1445 10/06/13 1500 10/06/13 1515 10/06/13 1528  BP: 108/67 121/77 113/73   Pulse: 65 80 64   Temp:    98.8 F (37.1 C)  TempSrc:    Oral  Resp: 21 20 14    SpO2:  100% 97% 98%     Physical Exam  Nursing note and vitals reviewed. Constitutional: She is oriented to person, place, and time. She appears well-developed and well-nourished. No distress.  HENT:  Head: Normocephalic and atraumatic.  Right Ear: External ear normal.  Left Ear: External ear normal.  Nose: Nose normal.  Mouth/Throat: Oropharynx is clear and moist.  Eyes: Conjunctivae are normal. Right eye exhibits no discharge. Left eye exhibits no discharge.  Neck: Normal range of motion. Neck supple.  Cardiovascular: Normal rate, regular rhythm and normal heart sounds.  Exam reveals no  gallop and no friction rub.   No murmur heard. Pulmonary/Chest: Effort normal and breath sounds normal. No respiratory distress. She has no wheezes. She has no rales. She exhibits no tenderness.  Abdominal: Soft. She exhibits no distension and no mass. There is no tenderness. There is no rebound and no guarding.  Musculoskeletal: Normal range of motion. She exhibits no edema and no tenderness.  Neurological: She is alert and oriented to person, place, and time.  No CVA, lumbar, or flank tenderness bilaterally.  Skin: Skin is warm and dry. She is not diaphoretic.     ED Course  Procedures (including critical care time) Labs Review Labs Reviewed  URINALYSIS, ROUTINE W REFLEX MICROSCOPIC - Abnormal; Notable for the following:    Color, Urine AMBER (*)    APPearance CLOUDY (*)    All other components within normal limits  POC URINE PREG, ED    Imaging Review No results found.   EKG Interpretation None      Results for orders placed during the hospital encounter of 10/06/13  URINALYSIS, ROUTINE W REFLEX MICROSCOPIC      Result Value Ref Range   Color, Urine AMBER (*) YELLOW   APPearance CLOUDY (*) CLEAR   Specific Gravity, Urine 1.024  1.005 - 1.030   pH 6.0  5.0 - 8.0   Glucose, UA NEGATIVE  NEGATIVE mg/dL   Hgb urine dipstick NEGATIVE  NEGATIVE   Bilirubin Urine NEGATIVE  NEGATIVE   Ketones, ur NEGATIVE  NEGATIVE mg/dL   Protein, ur NEGATIVE  NEGATIVE mg/dL   Urobilinogen, UA 1.0  0.0 - 1.0 mg/dL   Nitrite NEGATIVE  NEGATIVE   Leukocytes, UA NEGATIVE  NEGATIVE  POC URINE PREG, ED      Result Value Ref Range   Preg Test, Ur NEGATIVE  NEGATIVE     MDM   Marie Cochran is a 23 y.o. female with a PMH of anxiety, migraines who presents to the ED for evaluation of abdominal pain. Patient in ED for pregnancy test which was negative. Patient has hx of irregular menstrual cycles. Patient offered blood Beta HCG testing and pelvic exam however she declined. Will follow-up at her  scheduled appointment with women's health in two weeks. Patient denied any abdominal pain throughout her ED visit. Has had intermittent pain off and on for two weeks, but none recently. Abdominal exam benign. Vital signs stable. Patient afebrile and non-toxic in appearance.  eturn precautions, discharge instructions, and follow-up was discussed with the patient before discharge.      Final impressions: 1. Irregular menstrual cycle      Greer EeJessica Katlin Zeeva Courser PA-C          Marie Cochran, [place] 10/07/13 [phone]

## 2013-10-06 NOTE — Discharge Instructions (Signed)
Follow-up at your scheduled appointment with women's health in two weeks  Return to the emergency department if you develop any changing/worsening condition, fever, constant/changing/worsening abdominal pain, perfuse abdominal bleeding, feeling ill, feeling lightheaded, or any other concerns (please read additional information regarding your condition below)    Menstruation Menstruation is the monthly passing of blood, tissue, fluid and mucus, also know as a period. Your body is shedding the lining of the uterus. The flow, or amount of blood, usually lasts from 3 7 days each month. Hormones control the menstrual cycle. Hormones are a chemical substance produced by endocrine glands in the body to regulate different bodily functions. The first menstrual period may start any time between age 36 years to 16 years. However, it usually starts around age 23 years. Some girls have regular monthly menstrual cycles right from the beginning. However, it is not unusual to have only a couple of drops of blood or spotting when you first start menstruating. It is also not unusual to have two periods a month or miss a month or two when first starting your periods. SYMPTOMS   Mild to moderate abdominal cramps.  Aching or pain in the lower back area. Symptoms may occur 5 10 days before your menstrual period starts. These symptoms are referred to as premenstrual syndrome (PMS). These symptoms can include:  Headache.  Breast tenderness and swelling.  Bloating.  Tiredness (fatigue).  Mood changes.  Craving for certain foods. These are normal signs and symptoms and can vary in severity. To help relieve these problems, ask your caregiver if you can take over-the-counter medications for pain or discomfort. If the symptoms are not controllable, see your caregiver for help.  HORMONES INVOLVED IN MENSTRUATION Menstruation comes about because of hormones produced by the pituitary gland in the brain and the ovaries  that affect the uterine lining. First, the pituitary gland in the brain produces the hormone follicle stimulating hormone [facility](FSH). FSH stimulates the ovaries to produce estrogen, which thickens the uterine lining and begins to develop an egg in the ovary. About 14 days later, the pituitary gland produces another hormone called luteinizing hormone (LH). LH causes the egg to come out of a sac in the ovary (ovulation). The empty sac on the ovary called the corpus luteum is stimulated by another hormone from the pituitary gland called luteotropin. The corpus luteum begins to produce the estrogen and progesterone hormone. The progesterone hormone prepares the lining of the uterus to have the fertilized egg (egg combined with sperm) attach to the lining of the uterus and begin to develop into a fetus. If the egg is not fertilized, the corpus luteum stops producing estrogen and progesterone, it disappears, the lining of the uterus sloughs off and a menstrual period begins. Then the menstrual cycle starts all over again and will continue monthly unless pregnancy occurs or menopause begins. The secretion of hormones is complex. Various parts of the body become involved in many chemical activities. Female sex hormones have other functions in a woman's body as well. Estrogen increases a woman's sex drive (libido). It naturally helps body get rid of fluids (diuretic). It also aids in the process of building new bone. Therefore, maintaining hormonal health is essential to all levels of a woman's well being. These hormones are usually present in normal amounts and cause you to menstruate. It is the relationship between the (small) levels of the hormones that is critical. When the balance is upset, menstrual irregularities can occur. HOW DOES THE MENSTRUAL CYCLE  HAPPEN?  Menstrual cycles vary in length from 21 35 days with an average of 29 days. The cycle begins on the first day of bleeding. At this time, the pituitary gland in  the brain releases FSH that travels through the bloodstream to the ovaries. [facility] stimulates the follicles in the ovaries. This prepares the body for ovulation that occurs around the 14th day of the cycle. The ovaries produce estrogen, and this makes sure conditions are right in the uterus for implantation of the fertilized egg.  When the levels of estrogen reach a high enough level, it signals the gland in the brain (pituitary gland) to release a surge of LH. This causes the release of the ripest egg from its follicle (ovulation). Usually only one follicle releases one egg, but sometimes more than one follicle releases an egg especially when stimulating the ovaries for in vitro fertilization. The egg can then be collected by either fallopian tube to await fertilization. The burst follicle within the ovary that is left behind is now called the corpus luteum or "yellow body." The corpus luteum continues to give off (secrete) reduced amounts of estrogen. This closes and hardens the cervix. It dries up the mucus to the naturally infertile condition.  The corpus luteum also begins to give off greater amounts of progesterone. This causes the lining of the uterus (endometrium) to thicken even more in preparation for the fertilized egg. The egg is starting to journey down from the fallopian tube to the uterus. It also signals the ovaries to stop releasing eggs. It assists in returning the cervical mucus to its infertile state.  If the egg implants successfully into the womb lining and pregnancy occurs, progesterone levels will continue to raise. It is often this hormone that gives some pregnant women a feeling of well being, like a "natural high." Progesterone levels drop again after childbirth.  If fertilization does not occur, the corpus luteum dies, stopping the production of hormones. This sudden drop in progesterone causes the uterine lining to break down, accompanied by blood (menstruation).  This starts  the cycle back at day 1. The whole process starts all over again. Woman go through this cycle every month from puberty to menopause. Women have breaks only for pregnancy and breastfeeding (lactation), unless the woman has health problems that affect the female hormone system or chooses to use oral contraceptives to have unnatural menstrual periods. HOME CARE INSTRUCTIONS   Keep track of your periods by using a calendar.  If you use tampons, get the least absorbent to avoid toxic shock syndrome.  Do not leave tampons in the vagina over night or longer than 6 hours.  Wear a sanitary pad over night.  Exercise 3 5 times a week or more.  Avoid foods and drinks that you know will make your symptoms worse before or during your period. SEEK MEDICAL CARE IF:   You develop a fever with your period.  Your periods are lasting more than 7 days.  Your period is so heavy that you have to change pads or tampons every 30 minutes.  You develop clots with your period and never had clots before.  You cannot get relief from over-the-counter medication for your symptoms.  Your period has not started, and it has been longer than 35 days. Document Released: 04/02/2002 Document Revised: 01/31/2013 Document Reviewed: 11/09/2012 [facility] Patient Information 2014 [place], [place].

## 2013-10-11 NOTE — ED Provider Notes (Signed)
Medical screening examination/treatment/procedure(s) were performed by non-physician practitioner and as supervising physician I was immediately available for consultation/collaboration.   EKG Interpretation None        Jackquline Branca H Wong Steadham, MD 10/11/13 0702 

## 2013-10-24 ENCOUNTER — Ambulatory Visit: Payer: Self-pay | Admitting: Obstetrics & Gynecology

## 2014-04-26 NOTE — L&D Delivery Note (Cosign Needed)
Delivery Note At 12:32 AM a viable female was delivered via Vaginal, Spontaneous Delivery (Presentation: Middle Occiput Anterior).  APGAR: 5 ,7, 9 ; weight: 7+3.3 .   Placenta status: Intact, spont.  Cord: 3V with the following complications: None.  Cord pH: n/a  Anesthesia:   Episiotomy: None Lacerations: 1st degree vaginal Suture Repair: vicryl Est. Blood Loss (mL):  300  Mom to AICU.  Baby to Couplet care / Skin to Skin.   Patient is a [redacted]w[redacted]d at [redacted]w[redacted]d who was admitted IOL that presented with contractions and severe BP, significant hx of HSV2 and pyelonephritis prenatal course.  She progressed with augmentation via pitocin.   Clare Gandy, MD 1:06 AM   Patient is a G1 [redacted]w[redacted]d who was admitted for IOL due to postdates and gHTN, significant hx of prenatal pyelo. She progressed with augmentation via Pitocin.  I was gloved and present for delivery in its entirety.  Second stage of labor progressed.  Complications: none, other than pt to remain on PP Mag x 24hrs    Cam Hai, CNM 8:50 AM  12/19/2014

## 2014-05-01 ENCOUNTER — Encounter: Payer: Self-pay | Admitting: Family Medicine

## 2014-05-01 ENCOUNTER — Ambulatory Visit (INDEPENDENT_AMBULATORY_CARE_PROVIDER_SITE_OTHER): Payer: No Typology Code available for payment source | Admitting: Family Medicine

## 2014-05-01 VITALS — BP 121/75 | HR 72 | Temp 97.6°F | Ht 64.0 in | Wt 137.4 lb

## 2014-05-01 DIAGNOSIS — F419 Anxiety disorder, unspecified: Secondary | ICD-10-CM

## 2014-05-01 DIAGNOSIS — F32A Depression, unspecified: Secondary | ICD-10-CM

## 2014-05-01 DIAGNOSIS — Z3201 Encounter for pregnancy test, result positive: Secondary | ICD-10-CM

## 2014-05-01 DIAGNOSIS — F329 Major depressive disorder, single episode, unspecified: Secondary | ICD-10-CM

## 2014-05-01 DIAGNOSIS — Z3200 Encounter for pregnancy test, result unknown: Secondary | ICD-10-CM

## 2014-05-01 LAB — POCT URINE PREGNANCY: Preg Test, Ur: POSITIVE

## 2014-05-01 MED ORDER — PRENATAL VITAMINS 0.8 MG PO TABS: 1.0000 | ORAL_TABLET | Freq: Every day | ORAL | Status: DC

## 2014-05-01 NOTE — Patient Instructions (Addendum)
Thank you for coming in,   Congratulations on your pregnancy.   Please have the ultrasound performed and then follow up with me afterwards. This will determine how far along you are.    Please feel free to call with any questions or concerns at any time, at [phone]. --Dr. Jordan LikesSchmitz

## 2014-05-01 NOTE — Progress Notes (Signed)
   Subjective:    Patient ID: Marie Cochran, female    DOB: 1991-01-19, 24 y.o.   MRN: [id number]  HPI  Marie Cochran is here for amenorrhea.   She has been sexually active with her boyfriend and not using protection. She thinks she may be pregnant. This was not planned. She is unsure of her last cycle. She hasn't been pregnant before.   Patient has a history of TBI. Since then she has what she describes as bad anxiety. She has been prescribed trazodone in the past because she also has problems sleeping. Recently, she has been taking her mother's xanax for her anxiety. She hasn't been seen for her anxiety lately.    Review of Systems See HPI     Objective:   Physical Exam BP 121/75 mmHg  Pulse 72  Temp(Src) 97.6 F (36.4 C) (Oral)  Ht 5\' 4"  (1.626 m)  Wt 137 lb 6.4 oz (62.324 kg)  BMI 23.57 kg/m2  LMP  Gen: NAD, alert, cooperative with exam, well-appearing CV: RRR, good S1/S2, no murmur, no edema, capillary refill brisk  Resp: CTABL, no wheezes, non-labored Abd: SNTND, BS present, no guarding or organomegaly  Positive urine pregnancy test   PHQ-9: 3  GAD-7: 8     Assessment & Plan:

## 2014-05-02 DIAGNOSIS — Z3493 Encounter for supervision of normal pregnancy, unspecified, third trimester: Secondary | ICD-10-CM | POA: Insufficient documentation

## 2014-05-02 NOTE — Assessment & Plan Note (Signed)
Probably component of anxiety. She has taken trazodone in the past to help with her sleep. Told her to stop taking her mother's xanax. She reports having some components of mania while questioning.  - will f/u in one week - Mood questionnaire  - possible to start trazodone again or SSRI if mood questionnaire doesn't suggest Bipolar.  - Hx of TBI and seen by Dr. Hermelinda MedicusSchwartz. May need to be referred again due to this complicating PMH

## 2014-05-02 NOTE — Assessment & Plan Note (Signed)
Not a planned pregnancy but her boyfriend is excited. This will be her first pregnancy. She is unsure of her LMP.  - Dating Ultrasound  - f/u in one week for firth OB visit.  - PNV  - given OTC meds safe in pregnancy

## 2014-05-06 ENCOUNTER — Other Ambulatory Visit: Payer: Self-pay | Admitting: Family Medicine

## 2014-05-06 DIAGNOSIS — Z3201 Encounter for pregnancy test, result positive: Secondary | ICD-10-CM

## 2014-05-07 ENCOUNTER — Ambulatory Visit (HOSPITAL_COMMUNITY)
Admission: RE | Admit: 2014-05-07 | Discharge: 2014-05-07 | Disposition: A | Discharge status: Home / Self Care | Payer: No Typology Code available for payment source | Source: Ambulatory Visit | Attending: Family Medicine | Admitting: Family Medicine

## 2014-05-07 DIAGNOSIS — O3481 Maternal care for other abnormalities of pelvic organs, first trimester: Secondary | ICD-10-CM | POA: Diagnosis not present

## 2014-05-07 DIAGNOSIS — N8320 Unspecified ovarian cysts: Secondary | ICD-10-CM | POA: Diagnosis not present

## 2014-05-07 DIAGNOSIS — Z3A09 9 weeks gestation of pregnancy: Secondary | ICD-10-CM | POA: Insufficient documentation

## 2014-05-07 DIAGNOSIS — Z36 Encounter for antenatal screening of mother: Secondary | ICD-10-CM | POA: Insufficient documentation

## 2014-05-07 DIAGNOSIS — Z3201 Encounter for pregnancy test, result positive: Secondary | ICD-10-CM

## 2014-05-28 ENCOUNTER — Ambulatory Visit (INDEPENDENT_AMBULATORY_CARE_PROVIDER_SITE_OTHER): Payer: No Typology Code available for payment source | Admitting: Family Medicine

## 2014-05-28 ENCOUNTER — Other Ambulatory Visit (HOSPITAL_COMMUNITY)
Admission: RE | Admit: 2014-05-28 | Discharge: 2014-05-28 | Disposition: A | Discharge status: Home / Self Care | Payer: No Typology Code available for payment source | Source: Ambulatory Visit | Attending: Family Medicine | Admitting: Family Medicine

## 2014-05-28 ENCOUNTER — Encounter: Payer: Self-pay | Admitting: Family Medicine

## 2014-05-28 VITALS — BP 112/73 | HR 75 | Temp 98.2°F | Wt 136.6 lb

## 2014-05-28 DIAGNOSIS — Z3401 Encounter for supervision of normal first pregnancy, first trimester: Secondary | ICD-10-CM

## 2014-05-28 DIAGNOSIS — Z113 Encounter for screening for infections with a predominantly sexual mode of transmission: Secondary | ICD-10-CM | POA: Diagnosis not present

## 2014-05-28 DIAGNOSIS — Z3491 Encounter for supervision of normal pregnancy, unspecified, first trimester: Secondary | ICD-10-CM

## 2014-05-28 DIAGNOSIS — Z331 Pregnant state, incidental: Secondary | ICD-10-CM

## 2014-05-28 LAB — POCT URINALYSIS DIPSTICK
BILIRUBIN UA: NEGATIVE
Blood, UA: NEGATIVE
Glucose, UA: NEGATIVE
Ketones, UA: NEGATIVE
LEUKOCYTES UA: NEGATIVE
NITRITE UA: NEGATIVE
Protein, UA: NEGATIVE
Spec Grav, UA: 1.015
UROBILINOGEN UA: 0.2
pH, UA: 6

## 2014-05-28 LAB — OB RESULTS CONSOLE GC/CHLAMYDIA: Gonorrhea: NEGATIVE

## 2014-05-28 NOTE — Progress Notes (Deleted)
Azilee L Guzzi is a 24 y.o. yo G1P0 at 4962w6d who presents for her initial prenatal visit. Pregnancy  isplanned She reports frequent urination  She  isTaking PNV. See flow sheet for details.  PMH, POBH, FH, meds, allergies and Social Hx reviewed.  Prenatal exam:Gen: Well nourished, well developed.  No distress.  Vitals noted. HEENT: Normocephalic, atraumatic.  thyromegaly or thyroid nodules.  fair dentition. CV: RRR no murmur, gallops or rubs Lungs: CTA B.  Normal respiratory effort without wheezes or rales. Abd: soft, NTND. +BS.  Uterus not appreciated above pelvis. GU: Normal external female genitalia without lesions.  Nl vaginal, well rugated without lesions. No vaginal discharge.  Ext: No edema. Psych: Normal grooming and dress.  Not depressed or anxious appearing.  Normal thought content and process without flight of ideas or looseness of associations    Assessment/plan: 1) Pregnancy 7662w6d doing well.  Current pregnancy issues include none Dating is reliable with ultrasound  Prenatal labs reviewed and pending. Bleeding and pain precautions reviewed. Importance of prenatal vitamins reviewed.  Genetic screening offered.  Early glucola is not indicated.    Follow up 4 weeks.

## 2014-05-28 NOTE — Patient Instructions (Addendum)
Thank you for coming in,   Talk to your case manager at [facility] for any classes. If they don't have any then contact our social work [employer].   I will see you 4 weeks for your next visit.    Please feel free to call with any questions or concerns at any time, at [phone]. --Dr. Jordan Likes  First Trimester of Pregnancy The first trimester of pregnancy is from week 1 until the end of week 12 (months 1 through 3). A week after a sperm fertilizes an egg, the egg will implant on the wall of the uterus. This embryo will begin to develop into a baby. Genes from you and your partner are forming the baby. The female genes determine whether the baby is a boy or a girl. At 6-8 weeks, the eyes and face are formed, and the heartbeat can be seen on ultrasound. At the end of 12 weeks, all the baby's organs are formed.  Now that you are pregnant, you will want to do everything you can to have a healthy baby. Two of the most important things are to get good prenatal care and to follow your health care provider's instructions. Prenatal care is all the medical care you receive before the baby's birth. This care will help prevent, find, and treat any problems during the pregnancy and childbirth. BODY CHANGES Your body goes through many changes during pregnancy. The changes vary from woman to woman.   You may gain or lose a couple of pounds at first.  You may feel sick to your stomach (nauseous) and throw up (vomit). If the vomiting is uncontrollable, call your health care provider.  You may tire easily.  You may develop headaches that can be relieved by medicines approved by your health care provider.  You may urinate more often. Painful urination may mean you have a bladder infection.  You may develop heartburn as a result of your pregnancy.  You may develop constipation because certain hormones are causing the muscles that push waste through your intestines to slow down.  You may develop hemorrhoids or  swollen, bulging veins (varicose veins).  Your breasts may begin to grow larger and become tender. Your nipples may stick out more, and the tissue that surrounds them (areola) may become darker.  Your gums may bleed and may be sensitive to brushing and flossing.  Dark spots or blotches (chloasma, mask of pregnancy) may develop on your face. This will likely fade after the baby is born.  Your menstrual periods will stop.  You may have a loss of appetite.  You may develop cravings for certain kinds of food.  You may have changes in your emotions from day to day, such as being excited to be pregnant or being concerned that something may go wrong with the pregnancy and baby.  You may have more vivid and strange dreams.  You may have changes in your hair. These can include thickening of your hair, rapid growth, and changes in texture. Some women also have hair loss during or after pregnancy, or hair that feels dry or thin. Your hair will most likely return to normal after your baby is born. WHAT TO EXPECT AT YOUR PRENATAL VISITS During a routine prenatal visit:  You will be weighed to make sure you and the baby are growing normally.  Your blood pressure will be taken.  Your abdomen will be measured to track your baby's growth.  The fetal heartbeat will be listened to starting around week  10 or 12 of your pregnancy.  Test results from any previous visits will be discussed. Your health care provider may ask you:  How you are feeling.  If you are feeling the baby move.  If you have had any abnormal symptoms, such as leaking fluid, bleeding, severe headaches, or abdominal cramping.  If you have any questions. Other tests that may be performed during your first trimester include:  Blood tests to find your blood type and to check for the presence of any previous infections. They will also be used to check for low iron levels (anemia) and Rh antibodies. Later in the pregnancy, blood  tests for diabetes will be done along with other tests if problems develop.  Urine tests to check for infections, diabetes, or protein in the urine.  An ultrasound to confirm the proper growth and development of the baby.  An amniocentesis to check for possible genetic problems.  Fetal screens for spina bifida and Down syndrome.  You may need other tests to make sure you and the baby are doing well. HOME CARE INSTRUCTIONS  Medicines  Follow your health care provider's instructions regarding medicine use. Specific medicines may be either safe or unsafe to take during pregnancy.  Take your prenatal vitamins as directed.  If you develop constipation, try taking a stool softener if your health care provider approves. Diet  Eat regular, well-balanced meals. Choose a variety of foods, such as meat or vegetable-based protein, fish, milk and low-fat dairy products, vegetables, fruits, and whole grain breads and cereals. Your health care provider will help you determine the amount of weight gain that is right for you.  Avoid raw meat and uncooked cheese. These carry germs that can cause birth defects in the baby.  Eating four or five small meals rather than three large meals a day may help relieve nausea and vomiting. If you start to feel nauseous, eating a few soda crackers can be helpful. Drinking liquids between meals instead of during meals also seems to help nausea and vomiting.  If you develop constipation, eat more high-fiber foods, such as fresh vegetables or fruit and whole grains. Drink enough fluids to keep your urine clear or pale yellow. Activity and Exercise  Exercise only as directed by your health care provider. Exercising will help you:  Control your weight.  Stay in shape.  Be prepared for labor and delivery.  Experiencing pain or cramping in the lower abdomen or low back is a good sign that you should stop exercising. Check with your health care provider before  continuing normal exercises.  Try to avoid standing for long periods of time. Move your legs often if you must stand in one place for a long time.  Avoid heavy lifting.  Wear low-heeled shoes, and practice good posture.  You may continue to have sex unless your health care provider directs you otherwise. Relief of Pain or Discomfort  Wear a good support bra for breast tenderness.   Take warm sitz baths to soothe any pain or discomfort caused by hemorrhoids. Use hemorrhoid cream if your health care provider approves.   Rest with your legs elevated if you have leg cramps or low back pain.  If you develop varicose veins in your legs, wear support hose. Elevate your feet for 15 minutes, 3-4 times a day. Limit salt in your diet. Prenatal Care  Schedule your prenatal visits by the twelfth week of pregnancy. They are usually scheduled monthly at first, then more often in the  last 2 months before delivery.  Write down your questions. Take them to your prenatal visits.  Keep all your prenatal visits as directed by your health care provider. Safety  Wear your seat belt at all times when driving.  Make a list of emergency phone numbers, including numbers for family, friends, the hospital, and police and fire departments. General Tips  Ask your health care provider for a referral to a local prenatal education class. Begin classes no later than at the beginning of month 6 of your pregnancy.  Ask for help if you have counseling or nutritional needs during pregnancy. Your health care provider can offer advice or refer you to specialists for help with various needs.  Do not use hot tubs, steam rooms, or saunas.  Do not douche or use tampons or scented sanitary pads.  Do not cross your legs for long periods of time.  Avoid cat litter boxes and soil used by cats. These carry germs that can cause birth defects in the baby and possibly loss of the fetus by miscarriage or stillbirth.  Avoid  all smoking, herbs, alcohol, and medicines not prescribed by your health care provider. Chemicals in these affect the formation and growth of the baby.  Schedule a dentist appointment. At home, brush your teeth with a soft toothbrush and be gentle when you floss. SEEK MEDICAL CARE IF:   You have dizziness.  You have mild pelvic cramps, pelvic pressure, or nagging pain in the abdominal area.  You have persistent nausea, vomiting, or diarrhea.  You have a bad smelling vaginal discharge.  You have pain with urination.  You notice increased swelling in your face, hands, legs, or ankles. SEEK IMMEDIATE MEDICAL CARE IF:   You have a fever.  You are leaking fluid from your vagina.  You have spotting or bleeding from your vagina.  You have severe abdominal cramping or pain.  You have rapid weight gain or loss.  You vomit blood or material that looks like coffee grounds.  You are exposed to [place] measles and have never had them.  You are exposed to fifth disease or chickenpox.  You develop a severe headache.  You have shortness of breath.  You have any kind of trauma, such as from a fall or a car accident. Document Released: 04/06/2001 Document Revised: 08/27/2013 Document Reviewed: 02/20/2013 [facility] Patient Information 2015 [place], [place]. This information is not intended to replace advice given to you by your health care provider. Make sure you discuss any questions you have with your health care provider.

## 2014-05-29 LAB — OBSTETRIC PANEL
Antibody Screen: NEGATIVE
Basophils Absolute: 0 10*3/uL (ref 0.0–0.1)
Basophils Relative: 0 % (ref 0–1)
Eosinophils Absolute: 0.1 10*3/uL (ref 0.0–0.7)
Eosinophils Relative: 1 % (ref 0–5)
HEMATOCRIT: 41.3 % (ref 36.0–46.0)
Hemoglobin: 14.4 g/dL (ref 12.0–15.0)
Hepatitis B Surface Ag: NEGATIVE
Lymphocytes Relative: 15 % (ref 12–46)
Lymphs Abs: 1.9 10*3/uL (ref 0.7–4.0)
MCH: 31 pg (ref 26.0–34.0)
MCHC: 34.9 g/dL (ref 30.0–36.0)
MCV: 89 fL (ref 78.0–100.0)
MPV: 9.6 fL (ref 8.6–12.4)
Monocytes Absolute: 0.8 10*3/uL (ref 0.1–1.0)
Monocytes Relative: 6 % (ref 3–12)
NEUTROS ABS: 9.8 10*3/uL — AB (ref 1.7–7.7)
Neutrophils Relative %: 78 % — ABNORMAL HIGH (ref 43–77)
PLATELETS: 280 10*3/uL (ref 150–400)
RBC: 4.64 MIL/uL (ref 3.87–5.11)
RDW: 12.5 % (ref 11.5–15.5)
Rh Type: POSITIVE
Rubella: 1.43 Index — ABNORMAL HIGH (ref <0.90)
WBC: 12.6 10*3/uL — AB (ref 4.0–10.5)

## 2014-05-29 LAB — SICKLE CELL SCREEN: Sickle Cell Screen: NEGATIVE

## 2014-05-29 LAB — HIV ANTIBODY (ROUTINE TESTING W REFLEX): HIV: NONREACTIVE

## 2014-05-29 LAB — GC/CHLAMYDIA PROBE AMP (~~LOC~~) NOT AT ARMC
Chlamydia: NEGATIVE
Neisseria Gonorrhea: NEGATIVE

## 2014-05-29 NOTE — Progress Notes (Signed)
   Subjective:    Marie Cochran is a G1P0 3670w0d being seen today for her first obstetrical visit.  Her obstetrical history is significant for former smoker. Patient is unsure  intend to breast feed. Pregnancy history fully reviewed.  Patient reports no complaints.  Filed Vitals:   05/28/14 1352  BP: 112/73  Pulse: 75  Temp: 98.2 F (36.8 C)  Weight: 136 lb 9.6 oz (61.961 kg)    HISTORY: OB History  Gravida Para Term Preterm AB SAB TAB Ectopic Multiple Living  1             # Outcome Date GA Lbr Len/2nd Weight Sex Delivery Anes PTL Lv  1 Current              Past Medical History  Diagnosis Date  . Anxiety   . Migraines     ?due to needing glasses  . No pertinent past medical history    Past Surgical History  Procedure Laterality Date  . Orif humerus fracture  03/13/2012    Procedure: OPEN REDUCTION INTERNAL FIXATION (ORIF) PROXIMAL HUMERUS FRACTURE;  Surgeon: Mable ParisJustin William Chandler, MD;  [facility];  Service: Orthopedics;  Laterality: Left;  Irrigation and debridement done at 2058.  Marland Kitchen. Tendon repair  03/13/2012    Procedure: TENDON REPAIR;  Surgeon: Mable ParisJustin William Chandler, MD;  [facility];  Service: Orthopedics;;  start 2123  . Brain surgery     Family History  Problem Relation Age of Onset  . Depression Sister   . Diabetes       Exam    Uterus:     Pelvic Exam:    Perineum: normal   Vulva: Normal    Vagina:  normal mucosa   pH:    Cervix: no lesions   Adnexa: n/a   Bony Pelvis: n/a  System: Breast:  n/a   Skin: normal coloration and turgor, no rashes    Neurologic: oriented, normal, normal mood   Extremities: normal strength, tone, and muscle mass   HEENT PERRLA and extra ocular movement intact   Mouth/Teeth mucous membranes moist, pharynx normal without lesions   Neck supple   Cardiovascular: regular rate and rhythm   Respiratory:  appears well, vitals normal, no respiratory distress, acyanotic, normal RR, neck free of mass or  lymphadenopathy, chest clear, no wheezing, crepitations, rhonchi, normal symmetric air entry   Abdomen: soft, non-tender; bowel sounds normal; no masses,  no organomegaly   Urinary: urethral meatus normal      Assessment:    Pregnancy: G1P0 Patient Active Problem List   Diagnosis Date Noted  . First trimester pregnancy 05/02/2014  . Genital herpes 08/03/2012  . Insomnia 05/16/2012  . Depression 05/16/2012  . TBI (traumatic brain injury) 03/16/2012  . Anxiety 10/28/2011        Plan:     Initial labs drawn. Prenatal vitamins. Problem list reviewed and updated. Genetic Screening discussed Integrated Screen: requested.  Ultrasound discussed; fetal survey: requested.  Follow up in 4 weeks.   Myra RudeSchmitz, Eber Ferrufino E 05/29/2014

## 2014-05-29 NOTE — Assessment & Plan Note (Signed)
US dating puts her at 4252w6d. G1P0. Unsure if she will try to breast feed. Is interested in classes. Advised to speak with her case manager about the [facility] offer. If none available then will ask our SW  - initial OB labs today  - integrated screen ordered at patient's request  - continue PNV - f/u in 4 weeks.

## 2014-05-30 ENCOUNTER — Telehealth: Payer: Self-pay | Admitting: Family Medicine

## 2014-05-30 DIAGNOSIS — R8271 Bacteriuria: Secondary | ICD-10-CM

## 2014-05-30 LAB — CULTURE, OB URINE: Colony Count: >=100000

## 2014-05-30 MED ORDER — CEPHALEXIN 500 MG PO CAPS: 500.0000 mg | ORAL_CAPSULE | Freq: Four times a day (QID) | ORAL | Status: DC

## 2014-05-30 NOTE — Telephone Encounter (Signed)
Left VM to inform patient on her lab results. Her urine culture resulted to be positive for >100,000 e. coli. She has asymptomatic bacteriuria so she will need antibiotic treatment.   Keflex 500 mg Q6 for 5 days was sent to her pharmacy   Myra RudeJeremy E Masyn Fullam, MD PGY-2, [facility] 05/30/2014, 2:10 PM

## 2014-06-05 ENCOUNTER — Ambulatory Visit (HOSPITAL_COMMUNITY): Admission: RE | Admit: 2014-06-05 | Payer: No Typology Code available for payment source | Source: Ambulatory Visit

## 2014-06-07 ENCOUNTER — Other Ambulatory Visit: Payer: Self-pay | Admitting: Family Medicine

## 2014-06-07 NOTE — Telephone Encounter (Signed)
Called patient and informed that she missed her nuchal translucency test which is part of the integrated screening. [facility] about re-scheduling the test. If she is unable to re-schedule then may have to do the quad screen.    She was also unaware of her asymptomatic bacteriuria so she hasn't completed ABX. Informed that she should still complete the script of ABX.   Myra RudeJeremy E Zyron Deeley, MD PGY-2, [facility] 06/07/2014, 2:42 PM

## 2014-07-04 ENCOUNTER — Ambulatory Visit (INDEPENDENT_AMBULATORY_CARE_PROVIDER_SITE_OTHER): Payer: No Typology Code available for payment source | Admitting: Family Medicine

## 2014-07-04 VITALS — BP 111/60 | HR 96 | Temp 98.2°F | Wt 143.1 lb

## 2014-07-04 DIAGNOSIS — Z331 Pregnant state, incidental: Secondary | ICD-10-CM

## 2014-07-04 DIAGNOSIS — Z3492 Encounter for supervision of normal pregnancy, unspecified, second trimester: Secondary | ICD-10-CM

## 2014-07-04 NOTE — Progress Notes (Signed)
Marie Cochran is a 24 y.o. G1P0 at 5849w1d for routine follow up.  She reports bleeding that was minimal for one day and has since resolved. Reports good fetal movement. See flow sheet for details.  BP 111/60 mmHg  Pulse 96  Temp(Src) 98.2 F (36.8 C)  Wt 143 lb 1.6 oz (64.91 kg)  LMP 03/06/2014 Gen: NAD, alert, cooperative with exam, well-appearing CV: RRR, good S1/S2, no murmur, no edema, capillary refill brisk  Resp: CTABL, no wheezes, non-labored Abd: gravid   A/P: Pregnancy at 7949w1d.  Doing well.   Pregnancy issues include morning sickness  Anatomy ultrasound ordered to be scheduled at 18-19 weeks. Pt  is interested in genetic screening. Quad screen ordered as she missed the integrated screen.  Bleeding and pain precautions reviewed. Follow up 4 weeks.

## 2014-07-04 NOTE — Patient Instructions (Signed)
Thank you for coming in,   The anatomy scan will be scheduled soon.    Please feel free to call with any questions or concerns at any time, at [phone]. --Dr. Jordan LikesSchmitz  Second Trimester of Pregnancy The second trimester is from week 13 through week 28, months 4 through 6. The second trimester is often a time when you feel your best. Your body has also adjusted to being pregnant, and you begin to feel better physically. Usually, morning sickness has lessened or quit completely, you may have more energy, and you may have an increase in appetite. The second trimester is also a time when the fetus is growing rapidly. At the end of the sixth month, the fetus is about 9 inches long and weighs about 1 pounds. You will likely begin to feel the baby move (quickening) between 18 and 20 weeks of the pregnancy. BODY CHANGES Your body goes through many changes during pregnancy. The changes vary from woman to woman.   Your weight will continue to increase. You will notice your lower abdomen bulging out.  You may begin to get stretch marks on your hips, abdomen, and breasts.  You may develop headaches that can be relieved by medicines approved by your health care provider.  You may urinate more often because the fetus is pressing on your bladder.  You may develop or continue to have heartburn as a result of your pregnancy.  You may develop constipation because certain hormones are causing the muscles that push waste through your intestines to slow down.  You may develop hemorrhoids or swollen, bulging veins (varicose veins).  You may have back pain because of the weight gain and pregnancy hormones relaxing your joints between the bones in your pelvis and as a result of a shift in weight and the muscles that support your balance.  Your breasts will continue to grow and be tender.  Your gums may bleed and may be sensitive to brushing and flossing.  Dark spots or blotches (chloasma, mask of pregnancy)  may develop on your face. This will likely fade after the baby is born.  A dark line from your belly button to the pubic area (linea nigra) may appear. This will likely fade after the baby is born.  You may have changes in your hair. These can include thickening of your hair, rapid growth, and changes in texture. Some women also have hair loss during or after pregnancy, or hair that feels dry or thin. Your hair will most likely return to normal after your baby is born. WHAT TO EXPECT AT YOUR PRENATAL VISITS During a routine prenatal visit:  You will be weighed to make sure you and the fetus are growing normally.  Your blood pressure will be taken.  Your abdomen will be measured to track your baby's growth.  The fetal heartbeat will be listened to.  Any test results from the previous visit will be discussed. Your health care provider may ask you:  How you are feeling.  If you are feeling the baby move.  If you have had any abnormal symptoms, such as leaking fluid, bleeding, severe headaches, or abdominal cramping.  If you have any questions. Other tests that may be performed during your second trimester include:  Blood tests that check for:  Low iron levels (anemia).  Gestational diabetes (between 24 and 28 weeks).  Rh antibodies.  Urine tests to check for infections, diabetes, or protein in the urine.  An ultrasound to confirm the proper growth  and development of the baby.  An amniocentesis to check for possible genetic problems.  Fetal screens for spina bifida and Down syndrome. HOME CARE INSTRUCTIONS   Avoid all smoking, herbs, alcohol, and unprescribed drugs. These chemicals affect the formation and growth of the baby.  Follow your health care provider's instructions regarding medicine use. There are medicines that are either safe or unsafe to take during pregnancy.  Exercise only as directed by your health care provider. Experiencing uterine cramps is a good sign  to stop exercising.  Continue to eat regular, healthy meals.  Wear a good support bra for breast tenderness.  Do not use hot tubs, steam rooms, or saunas.  Wear your seat belt at all times when driving.  Avoid raw meat, uncooked cheese, cat litter boxes, and soil used by cats. These carry germs that can cause birth defects in the baby.  Take your prenatal vitamins.  Try taking a stool softener (if your health care provider approves) if you develop constipation. Eat more high-fiber foods, such as fresh vegetables or fruit and whole grains. Drink plenty of fluids to keep your urine clear or pale yellow.  Take warm sitz baths to soothe any pain or discomfort caused by hemorrhoids. Use hemorrhoid cream if your health care provider approves.  If you develop varicose veins, wear support hose. Elevate your feet for 15 minutes, 3-4 times a day. Limit salt in your diet.  Avoid heavy lifting, wear low heel shoes, and practice good posture.  Rest with your legs elevated if you have leg cramps or low back pain.  Visit your dentist if you have not gone yet during your pregnancy. Use a soft toothbrush to brush your teeth and be gentle when you floss.  A sexual relationship may be continued unless your health care provider directs you otherwise.  Continue to go to all your prenatal visits as directed by your health care provider. SEEK MEDICAL CARE IF:   You have dizziness.  You have mild pelvic cramps, pelvic pressure, or nagging pain in the abdominal area.  You have persistent nausea, vomiting, or diarrhea.  You have a bad smelling vaginal discharge.  You have pain with urination. SEEK IMMEDIATE MEDICAL CARE IF:   You have a fever.  You are leaking fluid from your vagina.  You have spotting or bleeding from your vagina.  You have severe abdominal cramping or pain.  You have rapid weight gain or loss.  You have shortness of breath with chest pain.  You notice sudden or extreme  swelling of your face, hands, ankles, feet, or legs.  You have not felt your baby move in over an hour.  You have severe headaches that do not go away with medicine.  You have vision changes. Document Released: 04/06/2001 Document Revised: 04/17/2013 Document Reviewed: 06/13/2012 [facility] Patient Information 2015 [place], [place]. This information is not intended to replace advice given to you by your health care provider. Make sure you discuss any questions you have with your health care provider.

## 2014-07-08 NOTE — Assessment & Plan Note (Signed)
No complaints. Missed integrated screen. Started working a second job. She completed the course of ABX for GBS on Urine cx  - Quad screen today  - Anatomy scan ordered.  - f/u in 4 weeks.  **Patient reports that she will be transferring care to a private OB office.

## 2014-07-10 ENCOUNTER — Ambulatory Visit (HOSPITAL_COMMUNITY)
Admission: RE | Admit: 2014-07-10 | Discharge: 2014-07-10 | Disposition: A | Discharge status: Home / Self Care | Payer: No Typology Code available for payment source | Source: Ambulatory Visit | Attending: Family Medicine | Admitting: Family Medicine

## 2014-07-10 ENCOUNTER — Other Ambulatory Visit: Payer: Self-pay | Admitting: Family Medicine

## 2014-07-10 DIAGNOSIS — Z3492 Encounter for supervision of normal pregnancy, unspecified, second trimester: Secondary | ICD-10-CM

## 2014-07-10 DIAGNOSIS — Z3A18 18 weeks gestation of pregnancy: Secondary | ICD-10-CM | POA: Insufficient documentation

## 2014-07-10 DIAGNOSIS — Z331 Pregnant state, incidental: Secondary | ICD-10-CM | POA: Insufficient documentation

## 2014-07-10 DIAGNOSIS — Z3689 Encounter for other specified antenatal screening: Secondary | ICD-10-CM | POA: Insufficient documentation

## 2014-07-29 ENCOUNTER — Ambulatory Visit (INDEPENDENT_AMBULATORY_CARE_PROVIDER_SITE_OTHER): Payer: No Typology Code available for payment source | Admitting: Family Medicine

## 2014-07-29 VITALS — BP 113/65 | HR 76 | Temp 98.0°F | Wt 146.0 lb

## 2014-07-29 DIAGNOSIS — Z331 Pregnant state, incidental: Secondary | ICD-10-CM

## 2014-07-29 DIAGNOSIS — Z3492 Encounter for supervision of normal pregnancy, unspecified, second trimester: Secondary | ICD-10-CM

## 2014-07-29 NOTE — Patient Instructions (Signed)
Thank you for coming in,   Please make an appointment with the OB clinic in our practice in 4 weeks.   Please make this appointment up front when you leave today.    Please feel free to call with any questions or concerns at any time, at [phone]. --Dr. Jordan LikesSchmitz  Second Trimester of Pregnancy The second trimester is from week 13 through week 28, months 4 through 6. The second trimester is often a time when you feel your best. Your body has also adjusted to being pregnant, and you begin to feel better physically. Usually, morning sickness has lessened or quit completely, you may have more energy, and you may have an increase in appetite. The second trimester is also a time when the fetus is growing rapidly. At the end of the sixth month, the fetus is about 9 inches long and weighs about 1 pounds. You will likely begin to feel the baby move (quickening) between 18 and 20 weeks of the pregnancy. BODY CHANGES Your body goes through many changes during pregnancy. The changes vary from woman to woman.   Your weight will continue to increase. You will notice your lower abdomen bulging out.  You may begin to get stretch marks on your hips, abdomen, and breasts.  You may develop headaches that can be relieved by medicines approved by your health care provider.  You may urinate more often because the fetus is pressing on your bladder.  You may develop or continue to have heartburn as a result of your pregnancy.  You may develop constipation because certain hormones are causing the muscles that push waste through your intestines to slow down.  You may develop hemorrhoids or swollen, bulging veins (varicose veins).  You may have back pain because of the weight gain and pregnancy hormones relaxing your joints between the bones in your pelvis and as a result of a shift in weight and the muscles that support your balance.  Your breasts will continue to grow and be tender.  Your gums may bleed and  may be sensitive to brushing and flossing.  Dark spots or blotches (chloasma, mask of pregnancy) may develop on your face. This will likely fade after the baby is born.  A dark line from your belly button to the pubic area (linea nigra) may appear. This will likely fade after the baby is born.  You may have changes in your hair. These can include thickening of your hair, rapid growth, and changes in texture. Some women also have hair loss during or after pregnancy, or hair that feels dry or thin. Your hair will most likely return to normal after your baby is born. WHAT TO EXPECT AT YOUR PRENATAL VISITS During a routine prenatal visit:  You will be weighed to make sure you and the fetus are growing normally.  Your blood pressure will be taken.  Your abdomen will be measured to track your baby's growth.  The fetal heartbeat will be listened to.  Any test results from the previous visit will be discussed. Your health care provider may ask you:  How you are feeling.  If you are feeling the baby move.  If you have had any abnormal symptoms, such as leaking fluid, bleeding, severe headaches, or abdominal cramping.  If you have any questions. Other tests that may be performed during your second trimester include:  Blood tests that check for:  Low iron levels (anemia).  Gestational diabetes (between 24 and 28 weeks).  Rh antibodies.  Urine  tests to check for infections, diabetes, or protein in the urine.  An ultrasound to confirm the proper growth and development of the baby.  An amniocentesis to check for possible genetic problems.  Fetal screens for spina bifida and Down syndrome. HOME CARE INSTRUCTIONS   Avoid all smoking, herbs, alcohol, and unprescribed drugs. These chemicals affect the formation and growth of the baby.  Follow your health care provider's instructions regarding medicine use. There are medicines that are either safe or unsafe to take during  pregnancy.  Exercise only as directed by your health care provider. Experiencing uterine cramps is a good sign to stop exercising.  Continue to eat regular, healthy meals.  Wear a good support bra for breast tenderness.  Do not use hot tubs, steam rooms, or saunas.  Wear your seat belt at all times when driving.  Avoid raw meat, uncooked cheese, cat litter boxes, and soil used by cats. These carry germs that can cause birth defects in the baby.  Take your prenatal vitamins.  Try taking a stool softener (if your health care provider approves) if you develop constipation. Eat more high-fiber foods, such as fresh vegetables or fruit and whole grains. Drink plenty of fluids to keep your urine clear or pale yellow.  Take warm sitz baths to soothe any pain or discomfort caused by hemorrhoids. Use hemorrhoid cream if your health care provider approves.  If you develop varicose veins, wear support hose. Elevate your feet for 15 minutes, 3-4 times a day. Limit salt in your diet.  Avoid heavy lifting, wear low heel shoes, and practice good posture.  Rest with your legs elevated if you have leg cramps or low back pain.  Visit your dentist if you have not gone yet during your pregnancy. Use a soft toothbrush to brush your teeth and be gentle when you floss.  A sexual relationship may be continued unless your health care provider directs you otherwise.  Continue to go to all your prenatal visits as directed by your health care provider. SEEK MEDICAL CARE IF:   You have dizziness.  You have mild pelvic cramps, pelvic pressure, or nagging pain in the abdominal area.  You have persistent nausea, vomiting, or diarrhea.  You have a bad smelling vaginal discharge.  You have pain with urination. SEEK IMMEDIATE MEDICAL CARE IF:   You have a fever.  You are leaking fluid from your vagina.  You have spotting or bleeding from your vagina.  You have severe abdominal cramping or pain.  You  have rapid weight gain or loss.  You have shortness of breath with chest pain.  You notice sudden or extreme swelling of your face, hands, ankles, feet, or legs.  You have not felt your baby move in over an hour.  You have severe headaches that do not go away with medicine.  You have vision changes. Document Released: 04/06/2001 Document Revised: 04/17/2013 Document Reviewed: 06/13/2012 [facility] Patient Information 2015 [place], [place]. This information is not intended to replace advice given to you by your health care provider. Make sure you discuss any questions you have with your health care provider.

## 2014-07-29 NOTE — Progress Notes (Signed)
Canyon L Vora is a 24 y.o. G1P0 at 4156w5d for routine follow up.  She reports no problems .  See flow sheet for details.  A/P: Pregnancy at 6556w5d.  Doing well.   Pregnancy issues include: none   Anatomy scan reviewed, problems are noted. Needs f/u for better view of spine due to baby position.  Preterm labor precautions reviewed. Follow up 4 weeks with me. Will f/u in OB clinic 4 weeks after my next visit.

## 2014-07-30 NOTE — Assessment & Plan Note (Signed)
Doing well with no complaints. Will need a repeat anatomy scan as they were not able to see the ductal arch and spine due to the positioning of the baby.  - US anatomy scan  - f/u in 4 weeks

## 2014-08-05 ENCOUNTER — Encounter: Payer: Self-pay | Admitting: Family Medicine

## 2014-08-05 ENCOUNTER — Telehealth: Payer: Self-pay | Admitting: Family Medicine

## 2014-08-05 NOTE — Telephone Encounter (Signed)
Left voicemail. I was going to talk to her about her quad screen results.   Marie RudeJeremy E Schmitz, MD PGY-2, [facility] 08/05/2014, 5:33 PM

## 2014-08-06 ENCOUNTER — Ambulatory Visit (HOSPITAL_COMMUNITY)
Admission: RE | Admit: 2014-08-06 | Discharge: 2014-08-06 | Disposition: A | Discharge status: Home / Self Care | Payer: No Typology Code available for payment source | Source: Ambulatory Visit | Attending: Family Medicine | Admitting: Family Medicine

## 2014-08-06 ENCOUNTER — Telehealth: Payer: Self-pay | Admitting: Family Medicine

## 2014-08-06 DIAGNOSIS — Z36 Encounter for antenatal screening of mother: Secondary | ICD-10-CM | POA: Insufficient documentation

## 2014-08-06 DIAGNOSIS — Z3A22 22 weeks gestation of pregnancy: Secondary | ICD-10-CM | POA: Diagnosis not present

## 2014-08-06 DIAGNOSIS — Z3492 Encounter for supervision of normal pregnancy, unspecified, second trimester: Secondary | ICD-10-CM

## 2014-08-06 NOTE — Telephone Encounter (Signed)
Pt is returning Dr. Jordan LikesSchmitz phone call. Marie Jacobsonjw

## 2014-08-06 NOTE — Telephone Encounter (Signed)
Spoke with patient in regards to her quad screen results which were normal.   Myra RudeJeremy E Schmitz, MD PGY-2, [facility] 08/06/2014, 1:23 PM

## 2014-08-27 ENCOUNTER — Ambulatory Visit (INDEPENDENT_AMBULATORY_CARE_PROVIDER_SITE_OTHER): Payer: No Typology Code available for payment source | Admitting: Family Medicine

## 2014-08-27 VITALS — BP 114/61 | HR 87 | Temp 97.7°F | Wt 149.0 lb

## 2014-08-27 DIAGNOSIS — J309 Allergic rhinitis, unspecified: Secondary | ICD-10-CM

## 2014-08-27 MED ORDER — FLUTICASONE PROPIONATE 50 MCG/ACT NA SUSP: 2.0000 | Freq: Every day | NASAL | Status: DC

## 2014-08-27 NOTE — Progress Notes (Signed)
Marie Cochran is a 24 y.o. G1P0 at 8023w6d for routine follow up.  She reports having rhinorrhea, congestion.  See flow sheet for details.  A/P: Pregnancy at 7423w6d.  Doing well.   Pregnancy issues include allergic rhinitis. Given Flonase  Anatomy scan reviewed, problems are noted.  Preterm labor precautions reviewed. Follow up 4 weeks. Will f/u with OB clinic at 28 weeks

## 2014-08-27 NOTE — Patient Instructions (Signed)
Thank you for coming in,   Please follow up in OB clinic for your 28 week appointment.   Please bring all of your medications with you to each visit.    Please feel free to call with any questions or concerns at any time, at [phone]. --Dr. Jordan LikesSchmitz  Second Trimester of Pregnancy The second trimester is from week 13 through week 28, months 4 through 6. The second trimester is often a time when you feel your best. Your body has also adjusted to being pregnant, and you begin to feel better physically. Usually, morning sickness has lessened or quit completely, you may have more energy, and you may have an increase in appetite. The second trimester is also a time when the fetus is growing rapidly. At the end of the sixth month, the fetus is about 9 inches long and weighs about 1 pounds. You will likely begin to feel the baby move (quickening) between 18 and 20 weeks of the pregnancy. BODY CHANGES Your body goes through many changes during pregnancy. The changes vary from woman to woman.   Your weight will continue to increase. You will notice your lower abdomen bulging out.  You may begin to get stretch marks on your hips, abdomen, and breasts.  You may develop headaches that can be relieved by medicines approved by your health care provider.  You may urinate more often because the fetus is pressing on your bladder.  You may develop or continue to have heartburn as a result of your pregnancy.  You may develop constipation because certain hormones are causing the muscles that push waste through your intestines to slow down.  You may develop hemorrhoids or swollen, bulging veins (varicose veins).  You may have back pain because of the weight gain and pregnancy hormones relaxing your joints between the bones in your pelvis and as a result of a shift in weight and the muscles that support your balance.  Your breasts will continue to grow and be tender.  Your gums may bleed and may be  sensitive to brushing and flossing.  Dark spots or blotches (chloasma, mask of pregnancy) may develop on your face. This will likely fade after the baby is born.  A dark line from your belly button to the pubic area (linea nigra) may appear. This will likely fade after the baby is born.  You may have changes in your hair. These can include thickening of your hair, rapid growth, and changes in texture. Some women also have hair loss during or after pregnancy, or hair that feels dry or thin. Your hair will most likely return to normal after your baby is born. WHAT TO EXPECT AT YOUR PRENATAL VISITS During a routine prenatal visit:  You will be weighed to make sure you and the fetus are growing normally.  Your blood pressure will be taken.  Your abdomen will be measured to track your baby's growth.  The fetal heartbeat will be listened to.  Any test results from the previous visit will be discussed. Your health care provider may ask you:  How you are feeling.  If you are feeling the baby move.  If you have had any abnormal symptoms, such as leaking fluid, bleeding, severe headaches, or abdominal cramping.  If you have any questions. Other tests that may be performed during your second trimester include:  Blood tests that check for:  Low iron levels (anemia).  Gestational diabetes (between 24 and 28 weeks).  Rh antibodies.  Urine tests to  check for infections, diabetes, or protein in the urine.  An ultrasound to confirm the proper growth and development of the baby.  An amniocentesis to check for possible genetic problems.  Fetal screens for spina bifida and Down syndrome. HOME CARE INSTRUCTIONS   Avoid all smoking, herbs, alcohol, and unprescribed drugs. These chemicals affect the formation and growth of the baby.  Follow your health care provider's instructions regarding medicine use. There are medicines that are either safe or unsafe to take during  pregnancy.  Exercise only as directed by your health care provider. Experiencing uterine cramps is a good sign to stop exercising.  Continue to eat regular, healthy meals.  Wear a good support bra for breast tenderness.  Do not use hot tubs, steam rooms, or saunas.  Wear your seat belt at all times when driving.  Avoid raw meat, uncooked cheese, cat litter boxes, and soil used by cats. These carry germs that can cause birth defects in the baby.  Take your prenatal vitamins.  Try taking a stool softener (if your health care provider approves) if you develop constipation. Eat more high-fiber foods, such as fresh vegetables or fruit and whole grains. Drink plenty of fluids to keep your urine clear or pale yellow.  Take warm sitz baths to soothe any pain or discomfort caused by hemorrhoids. Use hemorrhoid cream if your health care provider approves.  If you develop varicose veins, wear support hose. Elevate your feet for 15 minutes, 3-4 times a day. Limit salt in your diet.  Avoid heavy lifting, wear low heel shoes, and practice good posture.  Rest with your legs elevated if you have leg cramps or low back pain.  Visit your dentist if you have not gone yet during your pregnancy. Use a soft toothbrush to brush your teeth and be gentle when you floss.  A sexual relationship may be continued unless your health care provider directs you otherwise.  Continue to go to all your prenatal visits as directed by your health care provider. SEEK MEDICAL CARE IF:   You have dizziness.  You have mild pelvic cramps, pelvic pressure, or nagging pain in the abdominal area.  You have persistent nausea, vomiting, or diarrhea.  You have a bad smelling vaginal discharge.  You have pain with urination. SEEK IMMEDIATE MEDICAL CARE IF:   You have a fever.  You are leaking fluid from your vagina.  You have spotting or bleeding from your vagina.  You have severe abdominal cramping or pain.  You  have rapid weight gain or loss.  You have shortness of breath with chest pain.  You notice sudden or extreme swelling of your face, hands, ankles, feet, or legs.  You have not felt your baby move in over an hour.  You have severe headaches that do not go away with medicine.  You have vision changes. Document Released: 04/06/2001 Document Revised: 04/17/2013 Document Reviewed: 06/13/2012 [facility] Patient Information 2015 [place], [place]. This information is not intended to replace advice given to you by your health care provider. Make sure you discuss any questions you have with your health care provider.

## 2014-09-02 ENCOUNTER — Encounter (HOSPITAL_COMMUNITY): Payer: Self-pay | Admitting: *Deleted

## 2014-09-02 ENCOUNTER — Inpatient Hospital Stay (HOSPITAL_COMMUNITY)
Admission: AD | Admit: 2014-09-02 | Discharge: 2014-09-04 | DRG: 781 | Disposition: A | Discharge status: Home / Self Care | Payer: No Typology Code available for payment source | Source: Ambulatory Visit | Attending: Obstetrics and Gynecology | Admitting: Obstetrics and Gynecology

## 2014-09-02 DIAGNOSIS — Z3A25 25 weeks gestation of pregnancy: Secondary | ICD-10-CM | POA: Diagnosis not present

## 2014-09-02 DIAGNOSIS — R509 Fever, unspecified: Secondary | ICD-10-CM | POA: Diagnosis not present

## 2014-09-02 DIAGNOSIS — O2302 Infections of kidney in pregnancy, second trimester: Principal | ICD-10-CM | POA: Diagnosis present

## 2014-09-02 DIAGNOSIS — N12 Tubulo-interstitial nephritis, not specified as acute or chronic: Secondary | ICD-10-CM | POA: Diagnosis not present

## 2014-09-02 HISTORY — DX: Unspecified infectious disease: B99.9

## 2014-09-02 LAB — CBC
HCT: 34.1 % — ABNORMAL LOW (ref 36.0–46.0)
Hemoglobin: 11.8 g/dL — ABNORMAL LOW (ref 12.0–15.0)
MCH: 31.5 pg (ref 26.0–34.0)
MCHC: 34.6 g/dL (ref 30.0–36.0)
MCV: 90.9 fL (ref 78.0–100.0)
Platelets: 235 K/uL (ref 150–400)
RBC: 3.75 MIL/uL — ABNORMAL LOW (ref 3.87–5.11)
RDW: 12.9 % (ref 11.5–15.5)
WBC: 19.9 K/uL — ABNORMAL HIGH (ref 4.0–10.5)

## 2014-09-02 LAB — URINE MICROSCOPIC-ADD ON

## 2014-09-02 LAB — URINALYSIS, ROUTINE W REFLEX MICROSCOPIC
Bilirubin Urine: NEGATIVE
Glucose, UA: NEGATIVE mg/dL
Ketones, ur: 15 mg/dL — AB
NITRITE: NEGATIVE
PROTEIN: NEGATIVE mg/dL
Specific Gravity, Urine: 1.025 (ref 1.005–1.030)
UROBILINOGEN UA: 0.2 mg/dL (ref 0.0–1.0)
pH: 6 (ref 5.0–8.0)

## 2014-09-02 LAB — BASIC METABOLIC PANEL
ANION GAP: 10 (ref 5–15)
BUN: 6 mg/dL (ref 6–20)
CALCIUM: 8.5 mg/dL — AB (ref 8.9–10.3)
CHLORIDE: 103 mmol/L (ref 101–111)
CO2: 21 mmol/L — ABNORMAL LOW (ref 22–32)
Creatinine, Ser: 0.62 mg/dL (ref 0.44–1.00)
GFR calc Af Amer: >60 mL/min (ref >60)
GFR calc non Af Amer: >60 mL/min (ref >60)
Glucose, Bld: 162 mg/dL — ABNORMAL HIGH (ref 70–99)
POTASSIUM: 3.6 mmol/L (ref 3.5–5.1)
Sodium: 134 mmol/L — ABNORMAL LOW (ref 135–145)

## 2014-09-02 MED ORDER — SODIUM CHLORIDE 0.9 % IV SOLN
INTRAVENOUS | Status: DC
  Administered 2014-09-02 – 2014-09-03 (×5): via INTRAVENOUS

## 2014-09-02 MED ORDER — LACTATED RINGERS IV BOLUS (SEPSIS)
1000.0000 mL | Freq: Once | INTRAVENOUS | Status: AC
Start: 2014-09-02 — End: 2014-09-02
  Administered 2014-09-02: 1000 mL via INTRAVENOUS

## 2014-09-02 MED ORDER — PROMETHAZINE HCL 25 MG/ML IJ SOLN
25.0000 mg | Freq: Four times a day (QID) | INTRAMUSCULAR | Status: DC | PRN
  Administered 2014-09-02: 25 mg via INTRAVENOUS
  Filled 2014-09-02: qty 1

## 2014-09-02 MED ORDER — ZOLPIDEM TARTRATE 5 MG PO TABS
5.0000 mg | ORAL_TABLET | Freq: Every evening | ORAL | Status: DC | PRN
  Administered 2014-09-03 (×2): 5 mg via ORAL
  Filled 2014-09-02 (×2): qty 1

## 2014-09-02 MED ORDER — CEPHALEXIN 500 MG PO CAPS
500.0000 mg | ORAL_CAPSULE | Freq: Four times a day (QID) | ORAL | Status: DC
  Filled 2014-09-02: qty 1

## 2014-09-02 MED ORDER — FENTANYL CITRATE (PF) 100 MCG/2ML IJ SOLN
50.0000 ug | INTRAMUSCULAR | Status: DC | PRN
  Administered 2014-09-02 – 2014-09-03 (×4): 50 ug via INTRAVENOUS
  Filled 2014-09-02 (×4): qty 2

## 2014-09-02 MED ORDER — LACTATED RINGERS IV BOLUS (SEPSIS)
1000.0000 mL | Freq: Once | INTRAVENOUS | Status: AC
  Administered 2014-09-02: 1000 mL via INTRAVENOUS

## 2014-09-02 MED ORDER — PRENATAL MULTIVITAMIN CH
1.0000 | ORAL_TABLET | Freq: Every day | ORAL | Status: DC
  Administered 2014-09-02 – 2014-09-04 (×3): 1 via ORAL
  Filled 2014-09-02 (×4): qty 1

## 2014-09-02 MED ORDER — DOCUSATE SODIUM 100 MG PO CAPS
100.0000 mg | ORAL_CAPSULE | Freq: Every day | ORAL | Status: DC
  Administered 2014-09-02 – 2014-09-04 (×3): 100 mg via ORAL
  Filled 2014-09-02 (×3): qty 1

## 2014-09-02 MED ORDER — CEFTRIAXONE SODIUM IN DEXTROSE 20 MG/ML IV SOLN
1.0000 g | Freq: Two times a day (BID) | INTRAVENOUS | Status: DC
  Administered 2014-09-02 – 2014-09-04 (×5): 1 g via INTRAVENOUS
  Filled 2014-09-02 (×6): qty 50

## 2014-09-02 MED ORDER — PROMETHAZINE HCL 25 MG/ML IJ SOLN: 12.5000 mg | Freq: Four times a day (QID) | INTRAMUSCULAR | Status: DC | PRN

## 2014-09-02 MED ORDER — ACETAMINOPHEN 325 MG PO TABS
650.0000 mg | ORAL_TABLET | ORAL | Status: DC | PRN
  Administered 2014-09-02 – 2014-09-04 (×6): 650 mg via ORAL
  Filled 2014-09-02 (×6): qty 2

## 2014-09-02 MED ORDER — CALCIUM CARBONATE ANTACID 500 MG PO CHEW: 2.0000 | CHEWABLE_TABLET | ORAL | Status: DC | PRN

## 2014-09-02 NOTE — MAU Provider Note (Signed)
History     CSN: [id number]  Arrival date and time: 09/02/14 [id number]   First Provider Initiated Contact with Patient 09/02/14 [phone]      Chief Complaint  Patient presents with  . Back Pain   HPI Patient is 24 y.o. G1P0 [id number] here with complaints of fevers, diaphoresis, L sided back pain.  No h/o kidney stones. Saw Dr Elinor ParkinsonSchmidtz on Tues and notes that this is when pain started. She did not discuss this with him at that time because it did not feel that bad.  Fevers have been going on since Saturday.    Denies nausea, vomiting, urinary frequency, urgency, dysuria, hematuria. Denies LOF, VB, contractions, vaginal discharge. +FM and endorses tolerating PO well.  She has been taking Tylenol PRN for pain/fever.  Last dose 5am today.  OB History    Gravida Para Term Preterm AB TAB SAB Ectopic Multiple Living   1               Past Medical History  Diagnosis Date  . Anxiety   . Migraines     ?due to needing glasses  . No pertinent past medical history   . Infection     UTI    Past Surgical History  Procedure Laterality Date  . Orif humerus fracture  03/13/2012    Procedure: OPEN REDUCTION INTERNAL FIXATION (ORIF) PROXIMAL HUMERUS FRACTURE;  Surgeon: Mable ParisJustin William Chandler, MD;  [facility];  Service: Orthopedics;  Laterality: Left;  Irrigation and debridement done at 2058.  Marland Kitchen. Tendon repair  03/13/2012    Procedure: TENDON REPAIR;  Surgeon: Mable ParisJustin William Chandler, MD;  [facility];  Service: Orthopedics;;  start 2123    Family History  Problem Relation Age of Onset  . Depression Sister   . Diabetes Paternal Aunt   . Cancer Neg Hx   . Heart disease Neg Hx   . Hypertension Neg Hx   . Stroke Neg Hx     History  Substance Use Topics  . Smoking status: Never Smoker   . Smokeless tobacco: Never Used  . Alcohol Use: Yes     Comment: rare; not with preg    Allergies: No Known Allergies  Prescriptions prior to admission  Medication Sig Dispense Refill Last Dose  .  cephALEXin (KEFLEX) 500 MG capsule Take 1 capsule (500 mg total) by mouth 4 (four) times daily. 20 capsule 0   . fluticasone (FLONASE) 50 MCG/ACT nasal spray Place 2 sprays into both nostrils daily. 16 g 6   . Prenatal Multivit-Min-Fe-FA (PRENATAL VITAMINS) 0.8 MG tablet Take 1 tablet by mouth daily. 30 tablet 8     Review of Systems  Constitutional: Positive for fever, chills and diaphoresis.  HENT: Negative for congestion.   Eyes: Negative for blurred vision.  Respiratory: Negative for shortness of breath and wheezing.   Cardiovascular: Negative for chest pain, palpitations and leg swelling.  Gastrointestinal: Negative for nausea and vomiting.  Genitourinary: Positive for flank pain (L side). Negative for dysuria, urgency, frequency and hematuria.  Musculoskeletal: Negative for myalgias and falls.  Neurological: Negative for dizziness, sensory change and headaches.   Physical Exam   Blood pressure 125/78, pulse 122, temperature 98.1 F (36.7 C), temperature source Oral, resp. rate 18, height 5\' 3"  (1.6 m), weight 146 lb (66.225 kg), last menstrual period 03/06/2014.  Physical Exam  Constitutional: She is oriented to person, place, and time. She appears well-developed and well-nourished. No distress.  HENT:  Head: Normocephalic and atraumatic.  Eyes:  EOM are normal. No scleral icterus.  Neck: Normal range of motion. Neck supple.  Cardiovascular: Regular rhythm, normal heart sounds and intact distal pulses.   No murmur heard. Respiratory: Effort normal and breath sounds normal. No respiratory distress.  GI: Soft. Bowel sounds are normal.  gravid  Musculoskeletal: Normal range of motion. She exhibits no edema.  +CVA TTP, no suprapubic TTP  Neurological: She is alert and oriented to person, place, and time.  Skin: Skin is warm and dry. She is not diaphoretic.  Psychiatric: She has a normal mood and affect. Her behavior is normal.   Fetal monitoringBaseline: 165 bpm Uterine  activity None Results for orders placed or performed during the hospital encounter of 09/02/14 (from the past 24 hour(s))  Urinalysis, Routine w reflex microscopic     Status: Abnormal   Collection Time: 09/02/14  9:05 AM  Result Value Ref Range   Color, Urine YELLOW YELLOW   APPearance HAZY (A) CLEAR   Specific Gravity, Urine 1.025 1.005 - 1.030   pH 6.0 5.0 - 8.0   Glucose, UA NEGATIVE NEGATIVE mg/dL   Hgb urine dipstick TRACE (A) NEGATIVE   Bilirubin Urine NEGATIVE NEGATIVE   Ketones, ur 15 (A) NEGATIVE mg/dL   Protein, ur NEGATIVE NEGATIVE mg/dL   Urobilinogen, UA 0.2 0.0 - 1.0 mg/dL   Nitrite NEGATIVE NEGATIVE   Leukocytes, UA MODERATE (A) NEGATIVE  Urine microscopic-add on     Status: Abnormal   Collection Time: 09/02/14  9:05 AM  Result Value Ref Range   Squamous Epithelial / LPF RARE RARE   WBC, UA 21-50 <3 WBC/hpf   Bacteria, UA MANY (A) RARE  CBC     Status: Abnormal   Collection Time: 09/02/14  9:38 AM  Result Value Ref Range   WBC 19.9 (H) 4.0 - 10.5 K/uL   RBC 3.75 (L) 3.87 - 5.11 MIL/uL   Hemoglobin 11.8 (L) 12.0 - 15.0 g/dL   HCT 96.034.1 (L) 45.436.0 - 09.846.0 %   MCV 90.9 78.0 - 100.0 fL   MCH 31.5 26.0 - 34.0 pg   MCHC 34.6 30.0 - 36.0 g/dL   RDW 11.912.9 14.711.5 - 82.915.5 %   Platelets 235 150 - 400 K/uL    MAU Course  Procedures  MDM NST appropriate for GA UA, OB cx CBC, BMET IVF with LR  Assessment and Plan  UA with moderate Leukocytes, many bacteria, trace Hgb.  WBC 19.9. Very suggestive of pyelonephritis during pregnancy.  Patient tachycardic but does not appear toxic. -Admit to inpatient for IV abx  Raliegh IpGottschalk, Gavriella Hearst M, DO 09/02/2014, 9:33 AM

## 2014-09-02 NOTE — Progress Notes (Signed)
Pt. Transferred to room 166 via w/c with Montez MoritaErin Hampton RN pt. Alert ox3

## 2014-09-02 NOTE — Progress Notes (Signed)
2000  Dr. Jolayne Pantheronstant notified of fetal tachycardia and uterine irritability.  Orders for IV LR 1L bolus and Tylenol Q 4 hours.  OBRR RN to continue EFM until fetal tachycardia resolves.

## 2014-09-02 NOTE — MAU Note (Addendum)
Started feeling hot/cold on Sat off and on. Doesn't have thermometer.    Started having pain on left mid back on Tues, since Fri it has been bad.  Hurts to take deep breaths.  +left CVA tenderness.  Did not have ride to come in . Missed work last 2 days

## 2014-09-02 NOTE — H&P (Signed)
Marie Cochran is a 24 y.o. G1P0 at [redacted]w[redacted]d admitted for pyelonephritis.   0  History of Present Illness: HPI  Patient is 24 y.o. G1P0 [redacted]w[redacted]d here with complaints of fevers, diaphoresis, L sided back pain. No h/o kidney stones. Saw Dr Elinor Parkinson on Tues and notes that this is when pain started. She did not discuss this with him at that time because it did not feel that bad. Fevers have been going on since Saturday. Denies nausea, vomiting, urinary frequency, urgency, dysuria, hematuria. Denies LOF, VB, contractions, vaginal discharge. +FM and endorses tolerating PO well. She has been taking Tylenol PRN for pain/fever. Last dose 5am today  Patient reports the fetal movement as active. Patient reports uterine contraction  activity as none. Patient reports  vaginal bleeding as none. Patient describes fluid per vagina as None.  Patient Active Problem List   Diagnosis Date Noted  . Pyelonephritis affecting pregnancy in second trimester, antepartum 09/02/2014  . [redacted] weeks gestation of pregnancy   . Encounter for fetal anatomic survey   . [redacted] weeks gestation of pregnancy   . Second trimester pregnancy 05/02/2014  . Genital herpes 08/03/2012  . Insomnia 05/16/2012  . Depression 05/16/2012  . TBI (traumatic brain injury) 03/16/2012  . Anxiety 10/28/2011   Past Medical History: Past Medical History  Diagnosis Date  . Anxiety   . Migraines     ?due to needing glasses  . No pertinent past medical history   . Infection     UTI    Past Surgical History: Past Surgical History  Procedure Laterality Date  . Orif humerus fracture  03/13/2012    Procedure: OPEN REDUCTION INTERNAL FIXATION (ORIF) PROXIMAL HUMERUS FRACTURE;  Surgeon: Mable Paris, MD;  [facility] OR;  Service: Orthopedics;  Laterality: Left;  Irrigation and debridement done at 2058.  Marland Kitchen Tendon repair  03/13/2012    Procedure: TENDON REPAIR;  Surgeon: Mable Paris, MD;  Location: [facility] OR;  Service: Orthopedics;;   start 2123    Obstetrical History: OB History    Gravida Para Term Preterm AB TAB SAB Ectopic Multiple Living   1               Gynecological History: negative  Social History: History   Social History  . Marital Status: Single    Spouse Name: N/A  . Number of Children: N/A  . Years of Education: N/A   Social History Main Topics  . Smoking status: Never Smoker   . Smokeless tobacco: Never Used  . Alcohol Use: Yes     Comment: rare; not with preg  . Drug Use: No  . Sexual Activity: Not on file   Other Topics Concern  . None   Social History Narrative   ** Merged History Encounter **       Lives with her father, Marie Cochran (age 109)   Previous PCP: Dr. Arvella Nigh (last seen May 2013)   Single, unemployed       Last mamo- 2010, "regular"??; tetanus shot 2013, ECHO 2010 (normal), Xrays 2013    Family History: Family History  Problem Relation Age of Onset  . Depression Sister   . Diabetes Paternal Aunt   . Cancer Neg Hx   . Heart disease Neg Hx   . Hypertension Neg Hx   . Stroke Neg Hx     Allergies: No Known Allergies  Prescriptions prior to admission  Medication Sig Dispense Refill Last Dose  . acetaminophen (TYLENOL) 325 MG tablet  Take 650 mg by mouth every 6 (six) hours as needed for mild pain or fever.   09/02/2014 at Unknown time  . fluticasone (FLONASE) 50 MCG/ACT nasal spray Place 2 sprays into both nostrils daily. (Patient taking differently: Place 2 sprays into both nostrils daily as needed for allergies. ) 16 g 6 Past Week at Unknown time  . Prenatal Multivit-Min-Fe-FA (PRENATAL VITAMINS) 0.8 MG tablet Take 1 tablet by mouth daily. 30 tablet 8 09/01/2014 at Unknown time  . cephALEXin (KEFLEX) 500 MG capsule Take 1 capsule (500 mg total) by mouth 4 (four) times daily. (Patient not taking: Reported on 09/02/2014) 20 capsule 0 Completed Course at Unknown time     Review of Systems  Constitutional: Positive for diaphoresis, fever, chills. ENT: Negative for  congestion, sore throat  Eyes: Negative for blurred vision, double vision Respiratory: Negative for cough, shortness of breath, wheezing   Cardiovascular: Negative for chest pain, palpitations, leg swelling Gastrointestinal: Negative for abdominal pain, nausea, vomiting (had 1 episode of vomitus after H&P in MAU)  Genitourinary: Positive for flank pain. Negative for dysuria, urgency, frequency, hematuria  Musculoskeletal: Positive for L sided back pain. Negative for myalgias, joint pain and falls.  Skin: Negative for rash.  Neurological: Negative for dizziness, falls  Vitals:  Blood pressure 125/78, pulse 122, temperature 98.1 F (36.7 C), temperature source Oral, resp. rate 18, height 5\' 3"  (1.6 m), weight 146 lb (66.225 kg), last menstrual period 03/06/2014. Physical Examination: General appearance - alert, well appearing, and in no distress Mental status - alert, oriented to person, place, and time Eyes - pupils equal and reactive, extraocular eye movements intact Lymphatics - no palpable lymphadenopathy Chest - clear to auscultation, no wheezes, rales or rhonchi, symmetric air entry Heart - normal rate, tachycardic, normal S1, S2, no murmurs, rubs, clicks or gallops Abdomen - soft, nontender, nondistended, no masses or organomegaly Back exam - Lsided CVA tenderness noted, patient has FROM Musculoskeletal - no joint tenderness, deformity or swelling Extremities - peripheral pulses normal, no pedal edema, no clubbing or cyanosis Abdomen: gravid, +BS, NT/ND Pelvic Exam:examination not indicated Cervix: Not evaluated. and found to be not evaluated Extremities: extremities normal, atraumatic, no cyanosis or edema  Membranes:intact Fetal Monitoring:Baseline: 165 bpm Labs:  Results for orders placed or performed during the hospital encounter of 09/02/14 (from the past 24 hour(s))  Urinalysis, Routine w reflex microscopic   Collection Time: 09/02/14  9:05 AM  Result Value Ref Range    Color, Urine YELLOW YELLOW   APPearance HAZY (A) CLEAR   Specific Gravity, Urine 1.025 1.005 - 1.030   pH 6.0 5.0 - 8.0   Glucose, UA NEGATIVE NEGATIVE mg/dL   Hgb urine dipstick TRACE (A) NEGATIVE   Bilirubin Urine NEGATIVE NEGATIVE   Ketones, ur 15 (A) NEGATIVE mg/dL   Protein, ur NEGATIVE NEGATIVE mg/dL   Urobilinogen, UA 0.2 0.0 - 1.0 mg/dL   Nitrite NEGATIVE NEGATIVE   Leukocytes, UA MODERATE (A) NEGATIVE  Urine microscopic-add on   Collection Time: 09/02/14  9:05 AM  Result Value Ref Range   Squamous Epithelial / LPF RARE RARE   WBC, UA 21-50 <3 WBC/hpf   Bacteria, UA MANY (A) RARE  CBC   Collection Time: 09/02/14  9:38 AM  Result Value Ref Range   WBC 19.9 (H) 4.0 - 10.5 K/uL   RBC 3.75 (L) 3.87 - 5.11 MIL/uL   Hemoglobin 11.8 (L) 12.0 - 15.0 g/dL   HCT 19.134.1 (L) 47.836.0 - 29.546.0 %   MCV  90.9 78.0 - 100.0 fL   MCH 31.5 26.0 - 34.0 pg   MCHC 34.6 30.0 - 36.0 g/dL   RDW 16.112.9 09.611.5 - 04.515.5 %   Platelets 235 150 - 400 K/uL  Basic metabolic panel   Collection Time: 09/02/14  9:38 AM  Result Value Ref Range   Sodium 134 (L) 135 - 145 mmol/L   Potassium 3.6 3.5 - 5.1 mmol/L   Chloride 103 101 - 111 mmol/L   CO2 21 (L) 22 - 32 mmol/L   Glucose, Bld 162 (H) 70 - 99 mg/dL   BUN 6 6 - 20 mg/dL   Creatinine, Ser 4.090.62 0.44 - 1.00 mg/dL   Calcium 8.5 (L) 8.9 - 10.3 mg/dL   GFR calc non Af Amer >60 >60 mL/min   GFR calc Af Amer >60 >60 mL/min   Anion gap 10 5 - 15    Imaging Studies: [place] Ob Comp + 14 Wk  08/06/2014   OBSTETRICAL ULTRASOUND: This exam was performed within a [facility]. The OB US report was generated in the [facility] system, and faxed to the ordering physician.   This report is available in the [facility] [employer] PACS. See the [facility] Obstetric US report via the Image Link.   . cefTRIAXone (ROCEPHIN)  IV  1 g Intravenous Q12H  . docusate sodium  100 mg Oral Daily  . lactated ringers  1,000 mL Intravenous Once  . prenatal multivitamin  1 tablet Oral Q1200   I have  reviewed the patient's current medications.   ASSESSMENT: Patient Active Problem List   Diagnosis Date Noted  . Pyelonephritis affecting pregnancy in second trimester, antepartum 09/02/2014  . [redacted] weeks gestation of pregnancy   . Encounter for fetal anatomic survey   . [redacted] weeks gestation of pregnancy   . Second trimester pregnancy 05/02/2014  . Genital herpes 08/03/2012  . Insomnia 05/16/2012  . Depression 05/16/2012  . TBI (traumatic brain injury) 03/16/2012  . Anxiety 10/28/2011   A/P Pyelonephritis in pregnancy, 2nd trimester -Admit to Ante under Dr Jolayne Pantheronstant -Vitals per floor protocol -NST x30 mins q shift -Urine cx pending -Rocephin 1g q12, will transition to PO abx once cultures and sensitivities back -Tylenol PRN pain/fever -Promethazine PRN nausea  FEN/GI: s/p 1L fluid bolus with LR, then NS @100cc /h, Diet: thin liquids for now then PO as tolerated  Marie M. Nadine CountsGottschalk, DO PGY-1, [facility]   OB fellow attestation:  I have seen and examined this patient; I agree with above documentation in the resident's note.   Marie Cochran is a 24 y.o. G1P0 here for back pain and chills  PE: BP 109/66 mmHg  Pulse 105  Temp(Src) 97.6 F (36.4 C) (Oral)  Resp 18  Ht 5\' 3"  (1.6 m)  Wt 146 lb (66.225 kg)  BMI 25.87 kg/m2  SpO2 100%  LMP 03/06/2014 Gen: calm comfortable, NAD Resp: normal effort, no distress Abd: gravid LCVA TTP  ROS, labs, PMH reviewed  Plan admit to ante for likely pyelonephritis, although not yet febrile patient/fetus tachycardic with significant LCVA TTP and dirty urine Pyelonephritis: rocephin 1g BID - urine culture, IV fluids, pain medications prn  Marie Cochran 09/03/2014, 4:27 PM

## 2014-09-02 NOTE — MAU Note (Signed)
Called pharmacy regarding colace, will wait to give when transferred to floor.

## 2014-09-03 DIAGNOSIS — N12 Tubulo-interstitial nephritis, not specified as acute or chronic: Secondary | ICD-10-CM

## 2014-09-03 DIAGNOSIS — O2302 Infections of kidney in pregnancy, second trimester: Principal | ICD-10-CM

## 2014-09-03 LAB — CBC
HCT: 28.4 % — ABNORMAL LOW (ref 36.0–46.0)
Hemoglobin: 9.8 g/dL — ABNORMAL LOW (ref 12.0–15.0)
MCH: 30.8 pg (ref 26.0–34.0)
MCHC: 34.5 g/dL (ref 30.0–36.0)
MCV: 89.3 fL (ref 78.0–100.0)
Platelets: 206 10*3/uL (ref 150–400)
RBC: 3.18 MIL/uL — ABNORMAL LOW (ref 3.87–5.11)
RDW: 12.5 % (ref 11.5–15.5)
WBC: 15.9 10*3/uL — ABNORMAL HIGH (ref 4.0–10.5)

## 2014-09-03 LAB — TYPE AND SCREEN
ABO/RH(D): A POS
Antibody Screen: NEGATIVE

## 2014-09-03 MED ORDER — NIFEDIPINE 10 MG PO CAPS
10.0000 mg | ORAL_CAPSULE | Freq: Four times a day (QID) | ORAL | Status: DC | PRN
  Administered 2014-09-03: 10 mg via ORAL
  Filled 2014-09-03: qty 1

## 2014-09-03 MED ORDER — SODIUM CHLORIDE 0.9 % IV SOLN
Freq: Once | INTRAVENOUS | Status: AC
  Administered 2014-09-03: 04:00:00 via INTRAVENOUS

## 2014-09-03 NOTE — Progress Notes (Signed)
Marie Cochran reported that she is doing much better.  Last night was very scary for her, but she is feeling better and is less acutely scared, though she is still carrying some worry with her after all of this.  Please page as needs arise, [phone].  Marie LawrenceChaplain Marie Cochran 4:08 PM    09/03/14 1600  Clinical Encounter Type  Visited With Patient  Visit Type Initial  Referral From Nurse

## 2014-09-03 NOTE — Progress Notes (Signed)
Patient ID: Marie MinerMelissa L Cochran, female   DOB: 1990-08-25, 24 y.o.   MRN: [id number] FACULTY PRACTICE ANTEPARTUM(COMPREHENSIVE) NOTE  Marie Cochran is a 24 y.o. G1P0 with Estimated Date of Delivery: 12/11/14   By  early ultrasound at 5370w6d  who is admitted for pyelonephritis in pregnancy.    Fetal presentation is unsure. Length of Stay:  1  Days  Date of admission:09/02/2014  Subjective: Patient reports some improvement in her back pain but it is still present Patient reports the fetal movement as active. Patient reports uterine contraction  activity as none. Patient reports  vaginal bleeding as none. Patient describes fluid per vagina as None.  Vitals:  Blood pressure 105/50, pulse 132, temperature 99.7 F (37.6 C), temperature source Oral, resp. rate 16, height 5\' 3"  (1.6 m), weight 146 lb (66.225 kg), last menstrual period 03/06/2014, SpO2 100 %. Filed Vitals:   09/03/14 0430 09/03/14 0508 09/03/14 0618 09/03/14 0708  BP: 101/53  105/50   Pulse: 134  132   Temp:  99.5 F (37.5 C)  99.7 F (37.6 C)  TempSrc:  Oral  Oral  Resp: 16  16   Height:      Weight:      SpO2:       Physical Examination:  General appearance - alert, well appearing, and in no distress Fundal Height:  size equals dates Pelvic Exam:  examination not indicated Cervical Exam: Not evaluated. Extremities: extremities normal, atraumatic, no cyanosis or edema with DTRs 2+ bilaterally Membranes:intact Back: left CVA tenderness  Fetal Monitoring:  Baseline: 170 bpm, Variability: Good {> 6 bpm), Accelerations: Reactive and Decelerations: Absent   reactive Toco: no contractions  Labs:  Results for orders placed or performed during the hospital encounter of 09/02/14 (from the past 24 hour(s))  Urinalysis, Routine w reflex microscopic   Collection Time: 09/02/14  9:05 AM  Result Value Ref Range   Color, Urine YELLOW YELLOW   APPearance HAZY (A) CLEAR   Specific Gravity, Urine 1.025 1.005 - 1.030   pH 6.0 5.0 - 8.0   Glucose, UA NEGATIVE NEGATIVE mg/dL   Hgb urine dipstick TRACE (A) NEGATIVE   Bilirubin Urine NEGATIVE NEGATIVE   Ketones, ur 15 (A) NEGATIVE mg/dL   Protein, ur NEGATIVE NEGATIVE mg/dL   Urobilinogen, UA 0.2 0.0 - 1.0 mg/dL   Nitrite NEGATIVE NEGATIVE   Leukocytes, UA MODERATE (A) NEGATIVE  Urine microscopic-add on   Collection Time: 09/02/14  9:05 AM  Result Value Ref Range   Squamous Epithelial / LPF RARE RARE   WBC, UA 21-50 <3 WBC/hpf   Bacteria, UA MANY (A) RARE  CBC   Collection Time: 09/02/14  9:38 AM  Result Value Ref Range   WBC 19.9 (H) 4.0 - 10.5 K/uL   RBC 3.75 (L) 3.87 - 5.11 MIL/uL   Hemoglobin 11.8 (L) 12.0 - 15.0 g/dL   HCT 62.934.1 (L) 52.836.0 - 41.346.0 %   MCV 90.9 78.0 - 100.0 fL   MCH 31.5 26.0 - 34.0 pg   MCHC 34.6 30.0 - 36.0 g/dL   RDW 24.412.9 01.011.5 - 27.215.5 %   Platelets 235 150 - 400 K/uL  Basic metabolic panel   Collection Time: 09/02/14  9:38 AM  Result Value Ref Range   Sodium 134 (L) 135 - 145 mmol/L   Potassium 3.6 3.5 - 5.1 mmol/L   Chloride 103 101 - 111 mmol/L   CO2 21 (L) 22 - 32 mmol/L   Glucose, Bld 162 (H) 70 - 99  mg/dL   BUN 6 6 - 20 mg/dL   Creatinine, Ser 1.610.62 0.44 - 1.00 mg/dL   Calcium 8.5 (L) 8.9 - 10.3 mg/dL   GFR calc non Af Amer >60 >60 mL/min   GFR calc Af Amer >60 >60 mL/min   Anion gap 10 5 - 15    Imaging Studies:      Medications:  Scheduled . cefTRIAXone (ROCEPHIN)  IV  1 g Intravenous Q12H  . docusate sodium  100 mg Oral Daily  . prenatal multivitamin  1 tablet Oral Q1200   I have reviewed the patient's current medications.  ASSESSMENT: G1P0 4835w6d Estimated Date of Delivery: 12/11/14  Patient Active Problem List   Diagnosis Date Noted  . Pyelonephritis affecting pregnancy in second trimester, antepartum 09/02/2014  . [redacted] weeks gestation of pregnancy   . Encounter for fetal anatomic survey   . [redacted] weeks gestation of pregnancy   . Second trimester pregnancy 05/02/2014  . Genital herpes 08/03/2012  . Insomnia 05/16/2012  .  Depression 05/16/2012  . TBI (traumatic brain injury) 03/16/2012  . Anxiety 10/28/2011    PLAN: Patient with Tmax 100.7 at 7pm on 09/02/2014 Continue IV antibiotics Follow up urine culture Continue current antepartum care  Briceson Broadwater 09/03/2014,7:16 AM

## 2014-09-04 ENCOUNTER — Telehealth: Payer: Self-pay | Admitting: Obstetrics & Gynecology

## 2014-09-04 ENCOUNTER — Other Ambulatory Visit: Payer: Self-pay | Admitting: Obstetrics & Gynecology

## 2014-09-04 DIAGNOSIS — R8271 Bacteriuria: Secondary | ICD-10-CM

## 2014-09-04 DIAGNOSIS — O2300 Infections of kidney in pregnancy, unspecified trimester: Secondary | ICD-10-CM

## 2014-09-04 LAB — CULTURE, OB URINE: Colony Count: >=100000

## 2014-09-04 LAB — ABO/RH: ABO/RH(D): A POS

## 2014-09-04 MED ORDER — CEFADROXIL 500 MG PO CAPS: 500.0000 mg | ORAL_CAPSULE | Freq: Two times a day (BID) | ORAL | Status: DC

## 2014-09-04 MED ORDER — CEPHALEXIN 500 MG PO CAPS: 1000.0000 mg | ORAL_CAPSULE | Freq: Every day | ORAL | Status: DC

## 2014-09-04 MED ORDER — NITROFURANTOIN MONOHYD MACRO 100 MG PO CAPS: ORAL_CAPSULE | ORAL | Status: DC

## 2014-09-04 NOTE — Progress Notes (Signed)
Urine culture sensitivities pending, will discharge with duricef 500mg  BID x 14d, followed by macrobid 100mg  daily for remainder of pregnancy  Danyal Adorno ROCIO, MD 9:18 AM

## 2014-09-04 NOTE — Progress Notes (Signed)
Pt was discharged from the hospital earlier today.  [facility]  She cannot afford that.  Rx changed to Keflex 1000mg  q day  Mother also informed me that pt had emesis after discharge.   This was once 30min prev.  She was instructed to drink lots of clear fluids and if sx cont'd she should f/u.   clh-S

## 2014-09-04 NOTE — Discharge Summary (Signed)
Physician Discharge Summary  Patient ID: Marie MinerMelissa L Concannon MRN: [id number] DOB/AGE: 1990/08/22 24 y.o.  Admit date: 09/02/2014 Discharge date: 09/04/2014  Admission Diagnoses:  Discharge Diagnoses:  Active Problems:   Pyelonephritis affecting pregnancy in second trimester, antepartum   Discharged Condition: good  Hospital Course: Patient admitted with pyelonephritis, CVA TTP and maternal/fetal tachycardia, WBC 19.9.  Subsequently became febrile during yesterday at 1900.  Late on day of admission also started to have preterm contractions concerning for preterm labor which subsequently resolved with IV fluids/abx.  Consults: None  Significant Diagnostic Studies: CBC with wBC 19, urine culture with 100,000 E Coli sensitivities pending  Treatments: IV hydration, antibiotics: ceftriaxone and analgesia: percocet  Discharge Exam: Blood pressure 108/68, pulse 109, temperature 98 F (36.7 C), temperature source Oral, resp. rate 18, height 5\' 3"  (1.6 m), weight 146 lb (66.225 kg), last menstrual period 03/06/2014, SpO2 100 %. General appearance: alert, cooperative, no distress and nontoxic appearing Extremities: extremities normal, atraumatic, no cyanosis or edema Pulses: 2+ and symmetric Skin: Skin color, texture, turgor normal. No rashes or lesions  Disposition: 01-Home or Self Care  PLAN: will plan on discharge later today if pt remains afebrile pending urine culture sensitivities.  Urine culture in February with E Coli was pan-sensitive, likely discharge with duricef or sensitive abx.  Patient agreeable with plan and eager to be discharged.  Will also give 2nd dose of rocephin IP early prior to discharge tonight once has been afebrile x 24h for a total of 3d (abx x 6) of abx.    Medication List    ASK your doctor about these medications        acetaminophen 325 MG tablet  Commonly known as:  TYLENOL  Take 650 mg by mouth every 6 (six) hours as needed for mild pain or fever.     cephALEXin 500 MG capsule  Commonly known as:  KEFLEX  Take 1 capsule (500 mg total) by mouth 4 (four) times daily.     fluticasone 50 MCG/ACT nasal spray  Commonly known as:  FLONASE  Place 2 sprays into both nostrils daily.     Prenatal Vitamins 0.8 MG tablet  Take 1 tablet by mouth daily.           Follow-up Information    [facility]   Specialty:  Family Medicine   Contact information:   [address] [id number] [place] [id number] [phone]      Signed: Perry MountCOSTA,Cydnee Fuquay ROCIO 09/04/2014, 6:06 AM

## 2014-09-04 NOTE — Discharge Instructions (Addendum)
Take Duricef twice a day for 2 weeks, then take macrobid 100mg  once daily for the remainder of your pregnancy to prevent a repeat infection.    Pyelonephritis, Adult Pyelonephritis is a kidney infection. A kidney infection can happen quickly, or it can last for a long time. HOME CARE   Take your medicine (antibiotics) as told. Finish it even if you start to feel better.  Keep all doctor visits as told.  Drink enough fluids to keep your pee (urine) clear or pale yellow.  Only take medicine as told by your doctor. GET HELP RIGHT AWAY IF:   You have a fever or lasting symptoms for more than 2-3 days.  You have a fever and your symptoms suddenly get worse.  You cannot take your medicine or drink fluids as told.  You have chills and shaking.  You feel very weak or pass out (faint).  You do not feel better after 2 days. MAKE SURE YOU:  Understand these instructions.  Will watch your condition.  Will get help right away if you are not doing well or get worse. Document Released: 05/20/2004 Document Revised: 10/12/2011 Document Reviewed: 09/30/2010 [facility] Patient Information 2015 [place], [place]. This information is not intended to replace advice given to you by your health care provider. Make sure you discuss any questions you have with your health care provider.  Pregnancy and Urinary Tract Infection A urinary tract infection (UTI) is a bacterial infection of the urinary tract. Infection of the urinary tract can include the ureters, kidneys (pyelonephritis), bladder (cystitis), and urethra (urethritis). All pregnant women should be screened for bacteria in the urinary tract. Identifying and treating a UTI will decrease the risk of preterm labor and developing more serious infections in both the mother and baby. CAUSES Bacteria germs cause almost all UTIs.  RISK FACTORS Many factors can increase your chances of getting a UTI during pregnancy. These include:  Having a short  urethra.  Poor toilet and hygiene habits.  Sexual intercourse.  Blockage of urine along the urinary tract.  Problems with the pelvic muscles or nerves.  Diabetes.  Obesity.  Bladder problems after having several children.  Previous history of UTI. SIGNS AND SYMPTOMS   Pain, burning, or a stinging feeling when urinating.  Suddenly feeling the need to urinate right away (urgency).  Loss of bladder control (urinary incontinence).  Frequent urination, more than is common with pregnancy.  Lower abdominal or back discomfort.  Cloudy urine.  Blood in the urine (hematuria).  Fever. When the kidneys are infected, the symptoms may be:  Back pain.  Flank pain on the right side more so than the left.  Fever.  Chills.  Nausea.  Vomiting. DIAGNOSIS  A urinary tract infection is usually diagnosed through urine tests. Additional tests and procedures are sometimes done. These may include:  Ultrasound exam of the kidneys, ureters, bladder, and urethra.  Looking in the bladder with a lighted tube (cystoscopy). TREATMENT Typically, UTIs can be treated with antibiotic medicines.  HOME CARE INSTRUCTIONS   Only take over-the-counter or prescription medicines as directed by your health care provider. If you were prescribed antibiotics, take them as directed. Finish them even if you start to feel better.  Drink enough fluids to keep your urine clear or pale yellow.  Do not have sexual intercourse until the infection is gone and your health care provider says it is okay.  Make sure you are tested for UTIs throughout your pregnancy. These infections often come back. Preventing a  UTI in the Future  Practice good toilet habits. Always wipe from front to back. Use the tissue only once.  Do not hold your urine. Empty your bladder as soon as possible when the urge comes.  Do not douche or use deodorant sprays.  Wash with soap and warm water around the genital area and the  anus.  Empty your bladder before and after sexual intercourse.  Wear underwear with a cotton crotch.  Avoid caffeine and carbonated drinks. They can irritate the bladder.  Drink cranberry juice or take cranberry pills. This may decrease the risk of getting a UTI.  Do not drink alcohol.  Keep all your appointments and tests as scheduled. SEEK MEDICAL CARE IF:   Your symptoms get worse.  You are still having fevers 2 or more days after treatment begins.  You have a rash.  You feel that you are having problems with medicines prescribed.  You have abnormal vaginal discharge. SEEK IMMEDIATE MEDICAL CARE IF:   You have back or flank pain.  You have chills.  You have blood in your urine.  You have nausea and vomiting.  You have contractions of your uterus.  You have a gush of fluid from the vagina. MAKE SURE YOU:  Understand these instructions.   Will watch your condition.   Will get help right away if you are not doing well or get worse.  Document Released: 08/07/2010 Document Revised: 01/31/2013 Document Reviewed: 11/09/2012 [facility] Patient Information 2015 [place], [place]. This information is not intended to replace advice given to you by your health care provider. Make sure you discuss any questions you have with your health care provider.  Pyelonephritis, Adult Pyelonephritis is a kidney infection. In general, there are 2 main types of pyelonephritis:  Infections that come on quickly without any warning (acute pyelonephritis).  Infections that persist for a long period of time (chronic pyelonephritis). CAUSES  Two main causes of pyelonephritis are:  Bacteria traveling from the bladder to the kidney. This is a problem especially in pregnant women. The urine in the bladder can become filled with bacteria from multiple causes, including:  Inflammation of the prostate gland (prostatitis).  Sexual intercourse in females.  Bladder infection  (cystitis).  Bacteria traveling from the bloodstream to the tissue part of the kidney. Problems that may increase your risk of getting a kidney infection include:  Diabetes.  Kidney stones or bladder stones.  Cancer.  Catheters placed in the bladder.  Other abnormalities of the kidney or ureter. SYMPTOMS   Abdominal pain.  Pain in the side or flank area.  Fever.  Chills.  Upset stomach.  Blood in the urine (dark urine).  Frequent urination.  Strong or persistent urge to urinate.  Burning or stinging when urinating. DIAGNOSIS  Your caregiver may diagnose your kidney infection based on your symptoms. A urine sample may also be taken. TREATMENT  In general, treatment depends on how severe the infection is.   If the infection is mild and caught early, your caregiver may treat you with oral antibiotics and send you home.  If the infection is more severe, the bacteria may have gotten into the bloodstream. This will require intravenous (IV) antibiotics and a hospital stay. Symptoms may include:  High fever.  Severe flank pain.  Shaking chills.  Even after a hospital stay, your caregiver may require you to be on oral antibiotics for a period of time.  Other treatments may be required depending upon the cause of the infection. HOME CARE  INSTRUCTIONS   Take your antibiotics as directed. Finish them even if you start to feel better.  Make an appointment to have your urine checked to make sure the infection is gone.  Drink enough fluids to keep your urine clear or pale yellow.  Take medicines for the bladder if you have urgency and frequency of urination as directed by your caregiver. SEEK IMMEDIATE MEDICAL CARE IF:   You have a fever or persistent symptoms for more than 2-3 days.  You have a fever and your symptoms suddenly get worse.  You are unable to take your antibiotics or fluids.  You develop shaking chills.  You experience extreme weakness or  fainting.  There is no improvement after 2 days of treatment. MAKE SURE YOU:  Understand these instructions.  Will watch your condition.  Will get help right away if you are not doing well or get worse. Document Released: 04/12/2005 Document Revised: 10/12/2011 Document Reviewed: 09/16/2010 [facility] Patient Information 2015 [place], [place]. This information is not intended to replace advice given to you by your health care provider. Make sure you discuss any questions you have with your health care provider.

## 2014-09-05 NOTE — Telephone Encounter (Signed)
RX changed see prev note

## 2014-09-19 ENCOUNTER — Ambulatory Visit (INDEPENDENT_AMBULATORY_CARE_PROVIDER_SITE_OTHER): Payer: No Typology Code available for payment source | Admitting: Family Medicine

## 2014-09-19 VITALS — BP 112/76 | HR 111 | Temp 98.0°F | Wt 150.0 lb

## 2014-09-19 DIAGNOSIS — N898 Other specified noninflammatory disorders of vagina: Secondary | ICD-10-CM

## 2014-09-19 DIAGNOSIS — Z331 Pregnant state, incidental: Secondary | ICD-10-CM | POA: Diagnosis not present

## 2014-09-19 DIAGNOSIS — F32A Depression, unspecified: Secondary | ICD-10-CM

## 2014-09-19 DIAGNOSIS — Z3492 Encounter for supervision of normal pregnancy, unspecified, second trimester: Secondary | ICD-10-CM

## 2014-09-19 DIAGNOSIS — F329 Major depressive disorder, single episode, unspecified: Secondary | ICD-10-CM

## 2014-09-19 LAB — CBC
HEMATOCRIT: 35.8 % — AB (ref 36.0–46.0)
Hemoglobin: 12.5 g/dL (ref 12.0–15.0)
MCH: 30.8 pg (ref 26.0–34.0)
MCHC: 34.9 g/dL (ref 30.0–36.0)
MCV: 88.2 fL (ref 78.0–100.0)
MPV: 9.7 fL (ref 8.6–12.4)
Platelets: 269 10*3/uL (ref 150–400)
RBC: 4.06 MIL/uL (ref 3.87–5.11)
RDW: 13.5 % (ref 11.5–15.5)
WBC: 9.1 10*3/uL (ref 4.0–10.5)

## 2014-09-19 LAB — GLUCOSE, CAPILLARY
Comment 1: 1
GLUCOSE-CAPILLARY: 152 mg/dL — AB (ref 65–99)

## 2014-09-19 MED ORDER — FLUCONAZOLE 150 MG PO TABS: 150.0000 mg | ORAL_TABLET | Freq: Once | ORAL | Status: DC

## 2014-09-19 NOTE — Assessment & Plan Note (Signed)
PHQ-9: 3 - continue to monitor

## 2014-09-19 NOTE — Assessment & Plan Note (Signed)
Clumpy white discharge and itching, feels like prior yeast infections - diflucan

## 2014-09-19 NOTE — Progress Notes (Signed)
1 hr OB glucose done on 50 grams glucola  = 152 mg/dL   3 hr GTT scheduled for 09-24-14 @ 8:30

## 2014-09-19 NOTE — Patient Instructions (Signed)
Third Trimester of Pregnancy The third trimester is from week 29 through week 42, months 7 through 9. The third trimester is a time when the fetus is growing rapidly. At the end of the ninth month, the fetus is about 20 inches in length and weighs 6-10 pounds.  BODY CHANGES Your body goes through many changes during pregnancy. The changes vary from woman to woman.   Your weight will continue to increase. You can expect to gain 25-35 pounds (11-16 kg) by the end of the pregnancy.  You may begin to get stretch marks on your hips, abdomen, and breasts.  You may urinate more often because the fetus is moving lower into your pelvis and pressing on your bladder.  You may develop or continue to have heartburn as a result of your pregnancy.  You may develop constipation because certain hormones are causing the muscles that push waste through your intestines to slow down.  You may develop hemorrhoids or swollen, bulging veins (varicose veins).  You may have pelvic pain because of the weight gain and pregnancy hormones relaxing your joints between the bones in your pelvis. Backaches may result from overexertion of the muscles supporting your posture.  You may have changes in your hair. These can include thickening of your hair, rapid growth, and changes in texture. Some women also have hair loss during or after pregnancy, or hair that feels dry or thin. Your hair will most likely return to normal after your baby is born.  Your breasts will continue to grow and be tender. A yellow discharge may leak from your breasts called colostrum.  Your belly button may stick out.  You may feel short of breath because of your expanding uterus.  You may notice the fetus "dropping," or moving lower in your abdomen.  You may have a bloody mucus discharge. This usually occurs a few days to a week before labor begins.  Your cervix becomes thin and soft (effaced) near your due date. WHAT TO EXPECT AT YOUR PRENATAL  EXAMS  You will have prenatal exams every 2 weeks until week 36. Then, you will have weekly prenatal exams. During a routine prenatal visit:  You will be weighed to make sure you and the fetus are growing normally.  Your blood pressure is taken.  Your abdomen will be measured to track your baby's growth.  The fetal heartbeat will be listened to.  Any test results from the previous visit will be discussed.  You may have a cervical check near your due date to see if you have effaced. At around 36 weeks, your caregiver will check your cervix. At the same time, your caregiver will also perform a test on the secretions of the vaginal tissue. This test is to determine if a type of bacteria, Group B streptococcus, is present. Your caregiver will explain this further. Your caregiver may ask you:  What your birth plan is.  How you are feeling.  If you are feeling the baby move.  If you have had any abnormal symptoms, such as leaking fluid, bleeding, severe headaches, or abdominal cramping.  If you have any questions. Other tests or screenings that may be performed during your third trimester include:  Blood tests that check for low iron levels (anemia).  Fetal testing to check the health, activity level, and growth of the fetus. Testing is done if you have certain medical conditions or if there are problems during the pregnancy. FALSE LABOR You may feel small, irregular contractions that   eventually go away. These are called Braxton Hicks contractions, or false labor. Contractions may last for hours, days, or even weeks before true labor sets in. If contractions come at regular intervals, intensify, or become painful, it is best to be seen by your caregiver.  SIGNS OF LABOR   Menstrual-like cramps.  Contractions that are 5 minutes apart or less.  Contractions that start on the top of the uterus and spread down to the lower abdomen and back.  A sense of increased pelvic pressure or back  pain.  A watery or bloody mucus discharge that comes from the vagina. If you have any of these signs before the 37th week of pregnancy, call your caregiver right away. You need to go to the hospital to get checked immediately. HOME CARE INSTRUCTIONS   Avoid all smoking, herbs, alcohol, and unprescribed drugs. These chemicals affect the formation and growth of the baby.  Follow your caregiver's instructions regarding medicine use. There are medicines that are either safe or unsafe to take during pregnancy.  Exercise only as directed by your caregiver. Experiencing uterine cramps is a good sign to stop exercising.  Continue to eat regular, healthy meals.  Wear a good support bra for breast tenderness.  Do not use hot tubs, steam rooms, or saunas.  Wear your seat belt at all times when driving.  Avoid raw meat, uncooked cheese, cat litter boxes, and soil used by cats. These carry germs that can cause birth defects in the baby.  Take your prenatal vitamins.  Try taking a stool softener (if your caregiver approves) if you develop constipation. Eat more high-fiber foods, such as fresh vegetables or fruit and whole grains. Drink plenty of fluids to keep your urine clear or pale yellow.  Take warm sitz baths to soothe any pain or discomfort caused by hemorrhoids. Use hemorrhoid cream if your caregiver approves.  If you develop varicose veins, wear support hose. Elevate your feet for 15 minutes, 3-4 times a day. Limit salt in your diet.  Avoid heavy lifting, wear low heal shoes, and practice good posture.  Rest a lot with your legs elevated if you have leg cramps or low back pain.  Visit your dentist if you have not gone during your pregnancy. Use a soft toothbrush to brush your teeth and be gentle when you floss.  A sexual relationship may be continued unless your caregiver directs you otherwise.  Do not travel far distances unless it is absolutely necessary and only with the approval  of your caregiver.  Take prenatal classes to understand, practice, and ask questions about the labor and delivery.  Make a trial run to the hospital.  Pack your hospital bag.  Prepare the baby's nursery.  Continue to go to all your prenatal visits as directed by your caregiver. SEEK MEDICAL CARE IF:  You are unsure if you are in labor or if your water has broken.  You have dizziness.  You have mild pelvic cramps, pelvic pressure, or nagging pain in your abdominal area.  You have persistent nausea, vomiting, or diarrhea.  You have a bad smelling vaginal discharge.  You have pain with urination. SEEK IMMEDIATE MEDICAL CARE IF:   You have a fever.  You are leaking fluid from your vagina.  You have spotting or bleeding from your vagina.  You have severe abdominal cramping or pain.  You have rapid weight loss or gain.  You have shortness of breath with chest pain.  You notice sudden or extreme swelling   of your face, hands, ankles, feet, or legs.  You have not felt your baby move in over an hour.  You have severe headaches that do not go away with medicine.  You have vision changes. Document Released: 04/06/2001 Document Revised: 04/17/2013 Document Reviewed: 06/13/2012 [employer] Patient Information 2015 [employer]. This information is not intended to replace advice given to you by your health care provider. Make sure you discuss any questions you have with your health care provider.  

## 2014-09-19 NOTE — Progress Notes (Signed)
Marie Cochran is a 24 y.o. G1P0 at 3355w1d for routine follow up.  She reports normal FM, no bleeding, LOF, ctx.  See flow sheet for details.    A/P: Pregnancy at 5755w1d.  Doing well.   Pregnancy issues include complains of clumpy white vaginal discharge and itching x2 days  Infant feeding choice breast Contraception choice undecided, maybe nexplanon Infant circumcision desired yes  Tdapwas given today. 1 hour glucola, CBC, RPR, and HIV were done today.   Pregnancy medical home forms were done today and reviewed.   RH status was reviewed and pt does not need Rhogam.   Discussed pap smear, patient believes it was done 2-3 years ago and would prefer to wait until postpartum to repeat.   Childbirth and education classes were offered. Preterm labor precautions reviewed. Kick counts reviewed. Follow up 2 weeks.

## 2014-09-20 ENCOUNTER — Encounter: Payer: Self-pay | Admitting: Family Medicine

## 2014-09-20 LAB — RPR

## 2014-09-20 LAB — HIV ANTIBODY (ROUTINE TESTING W REFLEX): HIV 1&2 Ab, 4th Generation: NONREACTIVE

## 2014-09-24 ENCOUNTER — Other Ambulatory Visit (INDEPENDENT_AMBULATORY_CARE_PROVIDER_SITE_OTHER): Payer: No Typology Code available for payment source

## 2014-09-24 DIAGNOSIS — Z331 Pregnant state, incidental: Secondary | ICD-10-CM | POA: Diagnosis not present

## 2014-09-24 DIAGNOSIS — Z3492 Encounter for supervision of normal pregnancy, unspecified, second trimester: Secondary | ICD-10-CM

## 2014-09-24 LAB — GLUCOSE, CAPILLARY: Glucose-Capillary: 78 mg/dL (ref 65–99)

## 2014-09-24 NOTE — Progress Notes (Signed)
3 hr gtt done today Nyala Kirchner 

## 2014-09-25 LAB — GLUCOSE TOLERANCE, 3 HOURS
GLUCOSE 3 HOUR GTT: 96 mg/dL (ref 70–144)
GLUCOSE, 1 HOUR-GESTATIONAL: 139 mg/dL (ref 70–189)
GLUCOSE, 2 HOUR-GESTATIONAL: 126 mg/dL (ref 70–164)
Glucose Tolerance, Fasting: 69 mg/dL — ABNORMAL LOW (ref 70–104)

## 2014-09-26 ENCOUNTER — Telehealth: Payer: Self-pay | Admitting: Family Medicine

## 2014-09-26 NOTE — Telephone Encounter (Signed)
Spoke with patient about her 3 hr GTT that is normal.   Myra RudeJeremy E Schmitz, MD PGY-2, [facility] 09/26/2014, 3:39 PM

## 2014-10-21 ENCOUNTER — Ambulatory Visit (INDEPENDENT_AMBULATORY_CARE_PROVIDER_SITE_OTHER): Payer: Medicaid Other | Admitting: Family Medicine

## 2014-10-21 ENCOUNTER — Telehealth: Payer: Self-pay | Admitting: Family Medicine

## 2014-10-21 ENCOUNTER — Other Ambulatory Visit: Payer: Self-pay | Admitting: Family Medicine

## 2014-10-21 VITALS — BP 122/74 | HR 95 | Temp 98.1°F | Wt 156.0 lb

## 2014-10-21 DIAGNOSIS — Z3492 Encounter for supervision of normal pregnancy, unspecified, second trimester: Secondary | ICD-10-CM

## 2014-10-21 DIAGNOSIS — F4323 Adjustment disorder with mixed anxiety and depressed mood: Secondary | ICD-10-CM

## 2014-10-21 DIAGNOSIS — Z331 Pregnant state, incidental: Secondary | ICD-10-CM

## 2014-10-21 DIAGNOSIS — F419 Anxiety disorder, unspecified: Secondary | ICD-10-CM

## 2014-10-21 DIAGNOSIS — Z3493 Encounter for supervision of normal pregnancy, unspecified, third trimester: Secondary | ICD-10-CM

## 2014-10-21 DIAGNOSIS — Z23 Encounter for immunization: Secondary | ICD-10-CM | POA: Diagnosis not present

## 2014-10-21 LAB — POCT URINALYSIS DIPSTICK
BILIRUBIN UA: NEGATIVE
Blood, UA: NEGATIVE
Glucose, UA: NEGATIVE
Ketones, UA: NEGATIVE
LEUKOCYTES UA: NEGATIVE
NITRITE UA: NEGATIVE
PH UA: 6.5
Protein, UA: NEGATIVE
Spec Grav, UA: 1.025
UROBILINOGEN UA: 0.2

## 2014-10-21 MED ORDER — SERTRALINE HCL 25 MG PO TABS: 25.0000 mg | ORAL_TABLET | Freq: Every day | ORAL | Status: DC

## 2014-10-21 NOTE — Telephone Encounter (Signed)
Left VM for patient. If she calls back please have her speak with a nurse/CMA and ask her when the best number and time to call her back is. I want to discuss the type of medication to start for her anxiety.     Myra RudeJeremy E Pacey Willadsen, MD PGY-2, [facility] 10/21/2014, 12:16 PM

## 2014-10-21 NOTE — Progress Notes (Signed)
Marie Cochran is a 24 y.o. G1P0 at 618w5d for routine follow up.  She reports anxiety and hospital f/u. [facility] and unable to cope with her anxiety now. Would like to take something for it.  She is f/u regarding her pyelonephritis. She was placed on suppression therapy for the remainder of her pregnancy. She was taking ABX intermittently but is now compliant.  See flow sheet for details.  A/P: Pregnancy at 848w5d.  Doing well.   Pregnancy issues include pyelonephritis. Is on suppressive therapy.   Infant feeding choice breast feed  Contraception choice unsure at this time.  Infant circumcision desired unsure   Tdap was given today. GBS pos urine cx in first trimester.   Preterm labor precautions reviewed. Safe sleep discussed. Kick counts reviewed. Follow up 2 weeks.

## 2014-10-21 NOTE — Patient Instructions (Signed)
Thank you for coming in,   Please follow up with me in two weeks.   Please continue taking the antibiotics as prescribed.   Please bring all of your medications with you to each visit.    Please feel free to call with any questions or concerns at any time, at [phone]. --Dr. Jordan Likes  Third Trimester of Pregnancy The third trimester is from week 29 through week 42, months 7 through 9. The third trimester is a time when the fetus is growing rapidly. At the end of the ninth month, the fetus is about 20 inches in length and weighs 6-10 pounds.  BODY CHANGES Your body goes through many changes during pregnancy. The changes vary from woman to woman.   Your weight will continue to increase. You can expect to gain 25-35 pounds (11-16 kg) by the end of the pregnancy.  You may begin to get stretch marks on your hips, abdomen, and breasts.  You may urinate more often because the fetus is moving lower into your pelvis and pressing on your bladder.  You may develop or continue to have heartburn as a result of your pregnancy.  You may develop constipation because certain hormones are causing the muscles that push waste through your intestines to slow down.  You may develop hemorrhoids or swollen, bulging veins (varicose veins).  You may have pelvic pain because of the weight gain and pregnancy hormones relaxing your joints between the bones in your pelvis. Backaches may result from overexertion of the muscles supporting your posture.  You may have changes in your hair. These can include thickening of your hair, rapid growth, and changes in texture. Some women also have hair loss during or after pregnancy, or hair that feels dry or thin. Your hair will most likely return to normal after your baby is born.  Your breasts will continue to grow and be tender. A yellow discharge may leak from your breasts called colostrum.  Your belly button may stick out.  You may feel short of breath because of  your expanding uterus.  You may notice the fetus "dropping," or moving lower in your abdomen.  You may have a bloody mucus discharge. This usually occurs a few days to a week before labor begins.  Your cervix becomes thin and soft (effaced) near your due date. WHAT TO EXPECT AT YOUR PRENATAL EXAMS  You will have prenatal exams every 2 weeks until week 36. Then, you will have weekly prenatal exams. During a routine prenatal visit:  You will be weighed to make sure you and the fetus are growing normally.  Your blood pressure is taken.  Your abdomen will be measured to track your baby's growth.  The fetal heartbeat will be listened to.  Any test results from the previous visit will be discussed.  You may have a cervical check near your due date to see if you have effaced. At around 36 weeks, your caregiver will check your cervix. At the same time, your caregiver will also perform a test on the secretions of the vaginal tissue. This test is to determine if a type of bacteria, Group B streptococcus, is present. Your caregiver will explain this further. Your caregiver may ask you:  What your birth plan is.  How you are feeling.  If you are feeling the baby move.  If you have had any abnormal symptoms, such as leaking fluid, bleeding, severe headaches, or abdominal cramping.  If you have any questions. Other tests or screenings that  may be performed during your third trimester include:  Blood tests that check for low iron levels (anemia).  Fetal testing to check the health, activity level, and growth of the fetus. Testing is done if you have certain medical conditions or if there are problems during the pregnancy. FALSE LABOR You may feel small, irregular contractions that eventually go away. These are called Braxton Hicks contractions, or false labor. Contractions may last for hours, days, or even weeks before true labor sets in. If contractions come at regular intervals, intensify,  or become painful, it is best to be seen by your caregiver.  SIGNS OF LABOR   Menstrual-like cramps.  Contractions that are 5 minutes apart or less.  Contractions that start on the top of the uterus and spread down to the lower abdomen and back.  A sense of increased pelvic pressure or back pain.  A watery or bloody mucus discharge that comes from the vagina. If you have any of these signs before the 37th week of pregnancy, call your caregiver right away. You need to go to the hospital to get checked immediately. HOME CARE INSTRUCTIONS   Avoid all smoking, herbs, alcohol, and unprescribed drugs. These chemicals affect the formation and growth of the baby.  Follow your caregiver's instructions regarding medicine use. There are medicines that are either safe or unsafe to take during pregnancy.  Exercise only as directed by your caregiver. Experiencing uterine cramps is a good sign to stop exercising.  Continue to eat regular, healthy meals.  Wear a good support bra for breast tenderness.  Do not use hot tubs, steam rooms, or saunas.  Wear your seat belt at all times when driving.  Avoid raw meat, uncooked cheese, cat litter boxes, and soil used by cats. These carry germs that can cause birth defects in the baby.  Take your prenatal vitamins.  Try taking a stool softener (if your caregiver approves) if you develop constipation. Eat more high-fiber foods, such as fresh vegetables or fruit and whole grains. Drink plenty of fluids to keep your urine clear or pale yellow.  Take warm sitz baths to soothe any pain or discomfort caused by hemorrhoids. Use hemorrhoid cream if your caregiver approves.  If you develop varicose veins, wear support hose. Elevate your feet for 15 minutes, 3-4 times a day. Limit salt in your diet.  Avoid heavy lifting, wear low heal shoes, and practice good posture.  Rest a lot with your legs elevated if you have leg cramps or low back pain.  Visit your  dentist if you have not gone during your pregnancy. Use a soft toothbrush to brush your teeth and be gentle when you floss.  A sexual relationship may be continued unless your caregiver directs you otherwise.  Do not travel far distances unless it is absolutely necessary and only with the approval of your caregiver.  Take prenatal classes to understand, practice, and ask questions about the labor and delivery.  Make a trial run to the hospital.  Pack your hospital bag.  Prepare the baby's nursery.  Continue to go to all your prenatal visits as directed by your caregiver. SEEK MEDICAL CARE IF:  You are unsure if you are in labor or if your water has broken.  You have dizziness.  You have mild pelvic cramps, pelvic pressure, or nagging pain in your abdominal area.  You have persistent nausea, vomiting, or diarrhea.  You have a bad smelling vaginal discharge.  You have pain with urination. SEEK IMMEDIATE  MEDICAL CARE IF:   You have a fever.  You are leaking fluid from your vagina.  You have spotting or bleeding from your vagina.  You have severe abdominal cramping or pain.  You have rapid weight loss or gain.  You have shortness of breath with chest pain.  You notice sudden or extreme swelling of your face, hands, ankles, feet, or legs.  You have not felt your baby move in over an hour.  You have severe headaches that do not go away with medicine.  You have vision changes. Document Released: 04/06/2001 Document Revised: 04/17/2013 Document Reviewed: 06/13/2012 [facility] Patient Information 2015 [place], [place]. This information is not intended to replace advice given to you by your health care provider. Make sure you discuss any questions you have with your health care provider.

## 2014-10-21 NOTE — Assessment & Plan Note (Addendum)
Reports that her symptoms are getting worse and she is not able to cope with them  She would like to try something  - will discuss with the patient about her preference in starting a medication.  - discussed with Dr. Loreta AveAcosta.

## 2014-10-21 NOTE — Assessment & Plan Note (Signed)
Continues to do well.  - f/u in two weeks.

## 2014-11-05 ENCOUNTER — Ambulatory Visit (INDEPENDENT_AMBULATORY_CARE_PROVIDER_SITE_OTHER): Payer: Medicaid Other | Admitting: Family Medicine

## 2014-11-05 VITALS — BP 129/82 | HR 86 | Temp 98.0°F | Wt 156.0 lb

## 2014-11-05 DIAGNOSIS — Z3493 Encounter for supervision of normal pregnancy, unspecified, third trimester: Secondary | ICD-10-CM

## 2014-11-05 DIAGNOSIS — B009 Herpesviral infection, unspecified: Secondary | ICD-10-CM

## 2014-11-05 DIAGNOSIS — Z331 Pregnant state, incidental: Secondary | ICD-10-CM

## 2014-11-05 MED ORDER — ACYCLOVIR 400 MG PO TABS: 400.0000 mg | ORAL_TABLET | Freq: Every day | ORAL | Status: DC

## 2014-11-05 MED ORDER — ACYCLOVIR 400 MG PO TABS: 400.0000 mg | ORAL_TABLET | Freq: Three times a day (TID) | ORAL | Status: AC

## 2014-11-05 MED ORDER — ACYCLOVIR 400 MG PO TABS: 400.0000 mg | ORAL_TABLET | Freq: Two times a day (BID) | ORAL | Status: DC

## 2014-11-05 NOTE — Patient Instructions (Addendum)
Thank you for coming in,   Please start the acyclovir.   Please go to the MAU if you notice any contractions or leakage of fluid.   Please bring all of your medications with you to each visit.    Please feel free to call with any questions or concerns at any time, at [phone]. --Dr. Jordan LikesSchmitz

## 2014-11-05 NOTE — Progress Notes (Signed)
Marie Cochran is a 24 y.o. G1P0 at 511w6d for routine follow up.  She reports no problems. She can't tell much of an improvement in her anxiety.  See flow sheet for details.  A/P: Pregnancy at [redacted]w[redacted]d.  Doing well.   Pregnancy issues include anxiety.   Infant feeding choice Breastfeeding  Contraception choice not sure.  Infant circumcision desired yes  Tdapwas not given today. Already given  GBS/GC/CZ testing was not performed today.  Preterm labor precautions reviewed. Safe sleep discussed. Kick counts reviewed. Follow up 2 weeks.

## 2014-11-08 NOTE — Assessment & Plan Note (Signed)
Doing well.  - Started acyclovir TID for HSV  -  F/u in two weeks

## 2014-11-12 ENCOUNTER — Telehealth: Payer: Self-pay | Admitting: Family Medicine

## 2014-11-12 NOTE — Telephone Encounter (Signed)
Patient to receive dental care and in need of x-rays and anesthesia. Letter sent.   Marie RudeJeremy E Schmitz, MD PGY-3, [facility] 11/12/2014, 10:06 AM

## 2014-11-13 NOTE — Telephone Encounter (Signed)
Patient is asking for a rx for yeast infection.  And something for tootache pain.  Please contact patient to discuss.

## 2014-11-15 ENCOUNTER — Other Ambulatory Visit: Payer: Self-pay | Admitting: Family Medicine

## 2014-11-15 ENCOUNTER — Telehealth: Payer: Self-pay | Admitting: Family Medicine

## 2014-11-15 MED ORDER — CLOTRIMAZOLE 1 % VA CREA: 1.0000 | TOPICAL_CREAM | Freq: Every day | VAGINAL | Status: DC

## 2014-11-15 NOTE — Telephone Encounter (Signed)
Pt called because Dr. Jordan Likes was suppose to call in 2 medications to her pharmacy. She is at the pharmacy and nothing is there. jw

## 2014-11-15 NOTE — Telephone Encounter (Signed)
Left VM for patient. If she calls back please have her speak with a nurse/CMA and inform her that if she is thinks that she has a yeast infection then I can send something for that. Depending on how bad her toothaches are then she can just be taking Tylenol for the pain.   If any questions then please take the best time and phone number to call and I will try to call her back.   Myra Rude, MD PGY-3, [facility] 11/15/2014, 8:34 AM

## 2014-11-15 NOTE — Telephone Encounter (Signed)
Pt is returning a call to Dr. Carlisle Beers

## 2014-11-15 NOTE — Telephone Encounter (Signed)
Called pt, LV to call us back. Need to inform her that Dr. Jordan Likes put a note in her chart at 4:45 pm stating he was sending Rx. Sunday Spillers, CMA

## 2014-11-15 NOTE — Telephone Encounter (Signed)
Spoke with patient about her tooth pain and vaginal itching, odor and burning. She isn't able to have her f/u with the dentist until Aug 19. She is taking tylenol and oragel and her pain is not improved or controled. She is also having symptoms of a yeast infection that she has had before. Will send in clitrimazole for vaginal itching, order and tramadol 50 mg Q6 PRN #30 for oral pain.   Myra Rude, MD PGY-3, [facility] 11/15/2014, 4:53 PM

## 2014-11-21 ENCOUNTER — Other Ambulatory Visit (HOSPITAL_COMMUNITY)
Admission: RE | Admit: 2014-11-21 | Discharge: 2014-11-21 | Disposition: A | Discharge status: Home / Self Care | Payer: No Typology Code available for payment source | Source: Ambulatory Visit | Attending: Family Medicine | Admitting: Family Medicine

## 2014-11-21 ENCOUNTER — Ambulatory Visit (INDEPENDENT_AMBULATORY_CARE_PROVIDER_SITE_OTHER): Payer: Medicaid Other | Admitting: Family Medicine

## 2014-11-21 VITALS — BP 122/79 | HR 102 | Temp 98.0°F | Wt 158.5 lb

## 2014-11-21 DIAGNOSIS — Z113 Encounter for screening for infections with a predominantly sexual mode of transmission: Secondary | ICD-10-CM | POA: Insufficient documentation

## 2014-11-21 DIAGNOSIS — Z331 Pregnant state, incidental: Secondary | ICD-10-CM

## 2014-11-21 DIAGNOSIS — Z3493 Encounter for supervision of normal pregnancy, unspecified, third trimester: Secondary | ICD-10-CM

## 2014-11-21 NOTE — Progress Notes (Signed)
Marie Cochran is a 24 y.o. G1P0 at [redacted]w[redacted]d for routine follow up.  She reports no concerns.  See flow sheet for details.  A/P: Pregnancy at [redacted]w[redacted]d.  Doing well.   Pregnancy issues include anxiety (treated with medication)  Infant feeding choice breastfeeding  Contraception choice not sure  Infant circumcision desired yes GBS/GC/CZ results were reviewed today.   Needs GBS at next visit  Labor precautions reviewed. Kick counts reviewed.

## 2014-11-21 NOTE — Patient Instructions (Signed)
Thank you for coming in,   Please follow up with me in one week.   Please bring all of your medications with you to each visit.    Please feel free to call with any questions or concerns at any time, at [phone]. --Dr. Jordan Likes  Third Trimester of Pregnancy The third trimester is from week 29 through week 42, months 7 through 9. The third trimester is a time when the fetus is growing rapidly. At the end of the ninth month, the fetus is about 20 inches in length and weighs 6-10 pounds.  BODY CHANGES Your body goes through many changes during pregnancy. The changes vary from woman to woman.   Your weight will continue to increase. You can expect to gain 25-35 pounds (11-16 kg) by the end of the pregnancy.  You may begin to get stretch marks on your hips, abdomen, and breasts.  You may urinate more often because the fetus is moving lower into your pelvis and pressing on your bladder.  You may develop or continue to have heartburn as a result of your pregnancy.  You may develop constipation because certain hormones are causing the muscles that push waste through your intestines to slow down.  You may develop hemorrhoids or swollen, bulging veins (varicose veins).  You may have pelvic pain because of the weight gain and pregnancy hormones relaxing your joints between the bones in your pelvis. Backaches may result from overexertion of the muscles supporting your posture.  You may have changes in your hair. These can include thickening of your hair, rapid growth, and changes in texture. Some women also have hair loss during or after pregnancy, or hair that feels dry or thin. Your hair will most likely return to normal after your baby is born.  Your breasts will continue to grow and be tender. A yellow discharge may leak from your breasts called colostrum.  Your belly button may stick out.  You may feel short of breath because of your expanding uterus.  You may notice the fetus "dropping,"  or moving lower in your abdomen.  You may have a bloody mucus discharge. This usually occurs a few days to a week before labor begins.  Your cervix becomes thin and soft (effaced) near your due date. WHAT TO EXPECT AT YOUR PRENATAL EXAMS  You will have prenatal exams every 2 weeks until week 36. Then, you will have weekly prenatal exams. During a routine prenatal visit:  You will be weighed to make sure you and the fetus are growing normally.  Your blood pressure is taken.  Your abdomen will be measured to track your baby's growth.  The fetal heartbeat will be listened to.  Any test results from the previous visit will be discussed.  You may have a cervical check near your due date to see if you have effaced. At around 36 weeks, your caregiver will check your cervix. At the same time, your caregiver will also perform a test on the secretions of the vaginal tissue. This test is to determine if a type of bacteria, Group B streptococcus, is present. Your caregiver will explain this further. Your caregiver may ask you:  What your birth plan is.  How you are feeling.  If you are feeling the baby move.  If you have had any abnormal symptoms, such as leaking fluid, bleeding, severe headaches, or abdominal cramping.  If you have any questions. Other tests or screenings that may be performed during your third trimester include:  Blood tests that check for low iron levels (anemia).  Fetal testing to check the health, activity level, and growth of the fetus. Testing is done if you have certain medical conditions or if there are problems during the pregnancy. FALSE LABOR You may feel small, irregular contractions that eventually go away. These are called Braxton Hicks contractions, or false labor. Contractions may last for hours, days, or even weeks before true labor sets in. If contractions come at regular intervals, intensify, or become painful, it is best to be seen by your caregiver.   SIGNS OF LABOR   Menstrual-like cramps.  Contractions that are 5 minutes apart or less.  Contractions that start on the top of the uterus and spread down to the lower abdomen and back.  A sense of increased pelvic pressure or back pain.  A watery or bloody mucus discharge that comes from the vagina. If you have any of these signs before the 37th week of pregnancy, call your caregiver right away. You need to go to the hospital to get checked immediately. HOME CARE INSTRUCTIONS   Avoid all smoking, herbs, alcohol, and unprescribed drugs. These chemicals affect the formation and growth of the baby.  Follow your caregiver's instructions regarding medicine use. There are medicines that are either safe or unsafe to take during pregnancy.  Exercise only as directed by your caregiver. Experiencing uterine cramps is a good sign to stop exercising.  Continue to eat regular, healthy meals.  Wear a good support bra for breast tenderness.  Do not use hot tubs, steam rooms, or saunas.  Wear your seat belt at all times when driving.  Avoid raw meat, uncooked cheese, cat litter boxes, and soil used by cats. These carry germs that can cause birth defects in the baby.  Take your prenatal vitamins.  Try taking a stool softener (if your caregiver approves) if you develop constipation. Eat more high-fiber foods, such as fresh vegetables or fruit and whole grains. Drink plenty of fluids to keep your urine clear or pale yellow.  Take warm sitz baths to soothe any pain or discomfort caused by hemorrhoids. Use hemorrhoid cream if your caregiver approves.  If you develop varicose veins, wear support hose. Elevate your feet for 15 minutes, 3-4 times a day. Limit salt in your diet.  Avoid heavy lifting, wear low heal shoes, and practice good posture.  Rest a lot with your legs elevated if you have leg cramps or low back pain.  Visit your dentist if you have not gone during your pregnancy. Use a soft  toothbrush to brush your teeth and be gentle when you floss.  A sexual relationship may be continued unless your caregiver directs you otherwise.  Do not travel far distances unless it is absolutely necessary and only with the approval of your caregiver.  Take prenatal classes to understand, practice, and ask questions about the labor and delivery.  Make a trial run to the hospital.  Pack your hospital bag.  Prepare the baby's nursery.  Continue to go to all your prenatal visits as directed by your caregiver. SEEK MEDICAL CARE IF:  You are unsure if you are in labor or if your water has broken.  You have dizziness.  You have mild pelvic cramps, pelvic pressure, or nagging pain in your abdominal area.  You have persistent nausea, vomiting, or diarrhea.  You have a bad smelling vaginal discharge.  You have pain with urination. SEEK IMMEDIATE MEDICAL CARE IF:   You have a fever.  You are leaking fluid from your vagina.  You have spotting or bleeding from your vagina.  You have severe abdominal cramping or pain.  You have rapid weight loss or gain.  You have shortness of breath with chest pain.  You notice sudden or extreme swelling of your face, hands, ankles, feet, or legs.  You have not felt your baby move in over an hour.  You have severe headaches that do not go away with medicine.  You have vision changes. Document Released: 04/06/2001 Document Revised: 04/17/2013 Document Reviewed: 06/13/2012 [facility] Patient Information 2015 [place], [place]. This information is not intended to replace advice given to you by your health care provider. Make sure you discuss any questions you have with your health care provider.

## 2014-11-22 LAB — CERVICOVAGINAL ANCILLARY ONLY
CHLAMYDIA, DNA PROBE: NEGATIVE
Neisseria Gonorrhea: NEGATIVE

## 2014-11-24 NOTE — Assessment & Plan Note (Signed)
Progressing well  Continued zoloft for anxiety  Not taking tramdol for cavity. Just tylenol  GC/Ch done today  Need GBS at next visit  - f/u in one week

## 2014-11-28 ENCOUNTER — Ambulatory Visit (INDEPENDENT_AMBULATORY_CARE_PROVIDER_SITE_OTHER): Payer: Medicaid Other | Admitting: Family Medicine

## 2014-11-28 VITALS — BP 132/89 | HR 104 | Temp 97.9°F | Wt 159.4 lb

## 2014-11-28 DIAGNOSIS — Z3493 Encounter for supervision of normal pregnancy, unspecified, third trimester: Secondary | ICD-10-CM

## 2014-11-28 DIAGNOSIS — Z331 Pregnant state, incidental: Secondary | ICD-10-CM

## 2014-11-28 NOTE — Progress Notes (Signed)
Marie Cochran is a 24 y.o. G1P0 at [redacted]w[redacted]d for routine follow up.  She reports no problems .  See flow sheet for details.  A/P: Pregnancy at [redacted]w[redacted]d.  Doing well.   Pregnancy issues include HSV (acyclovir) anxiety.   Infant feeding choice breastfeeding  Contraception choice not sure  Infant circumcision desired yes GC/CZ results were reviewed today. GBS done today.   Labor precautions reviewed. Kick counts reviewed.

## 2014-11-28 NOTE — Patient Instructions (Signed)
Thank you for coming in,   Please follow up with me in one week.   Please bring all of your medications with you to each visit.    Please feel free to call with any questions or concerns at any time, at [phone]. --Dr. Jordan Likes  Third Trimester of Pregnancy The third trimester is from week 29 through week 42, months 7 through 9. The third trimester is a time when the fetus is growing rapidly. At the end of the ninth month, the fetus is about 20 inches in length and weighs 6-10 pounds.  BODY CHANGES Your body goes through many changes during pregnancy. The changes vary from woman to woman.   Your weight will continue to increase. You can expect to gain 25-35 pounds (11-16 kg) by the end of the pregnancy.  You may begin to get stretch marks on your hips, abdomen, and breasts.  You may urinate more often because the fetus is moving lower into your pelvis and pressing on your bladder.  You may develop or continue to have heartburn as a result of your pregnancy.  You may develop constipation because certain hormones are causing the muscles that push waste through your intestines to slow down.  You may develop hemorrhoids or swollen, bulging veins (varicose veins).  You may have pelvic pain because of the weight gain and pregnancy hormones relaxing your joints between the bones in your pelvis. Backaches may result from overexertion of the muscles supporting your posture.  You may have changes in your hair. These can include thickening of your hair, rapid growth, and changes in texture. Some women also have hair loss during or after pregnancy, or hair that feels dry or thin. Your hair will most likely return to normal after your baby is born.  Your breasts will continue to grow and be tender. A yellow discharge may leak from your breasts called colostrum.  Your belly button may stick out.  You may feel short of breath because of your expanding uterus.  You may notice the fetus "dropping,"  or moving lower in your abdomen.  You may have a bloody mucus discharge. This usually occurs a few days to a week before labor begins.  Your cervix becomes thin and soft (effaced) near your due date. WHAT TO EXPECT AT YOUR PRENATAL EXAMS  You will have prenatal exams every 2 weeks until week 36. Then, you will have weekly prenatal exams. During a routine prenatal visit:  You will be weighed to make sure you and the fetus are growing normally.  Your blood pressure is taken.  Your abdomen will be measured to track your baby's growth.  The fetal heartbeat will be listened to.  Any test results from the previous visit will be discussed.  You may have a cervical check near your due date to see if you have effaced. At around 36 weeks, your caregiver will check your cervix. At the same time, your caregiver will also perform a test on the secretions of the vaginal tissue. This test is to determine if a type of bacteria, Group B streptococcus, is present. Your caregiver will explain this further. Your caregiver may ask you:  What your birth plan is.  How you are feeling.  If you are feeling the baby move.  If you have had any abnormal symptoms, such as leaking fluid, bleeding, severe headaches, or abdominal cramping.  If you have any questions. Other tests or screenings that may be performed during your third trimester include:  Blood tests that check for low iron levels (anemia).  Fetal testing to check the health, activity level, and growth of the fetus. Testing is done if you have certain medical conditions or if there are problems during the pregnancy. FALSE LABOR You may feel small, irregular contractions that eventually go away. These are called Braxton Hicks contractions, or false labor. Contractions may last for hours, days, or even weeks before true labor sets in. If contractions come at regular intervals, intensify, or become painful, it is best to be seen by your caregiver.   SIGNS OF LABOR   Menstrual-like cramps.  Contractions that are 5 minutes apart or less.  Contractions that start on the top of the uterus and spread down to the lower abdomen and back.  A sense of increased pelvic pressure or back pain.  A watery or bloody mucus discharge that comes from the vagina. If you have any of these signs before the 37th week of pregnancy, call your caregiver right away. You need to go to the hospital to get checked immediately. HOME CARE INSTRUCTIONS   Avoid all smoking, herbs, alcohol, and unprescribed drugs. These chemicals affect the formation and growth of the baby.  Follow your caregiver's instructions regarding medicine use. There are medicines that are either safe or unsafe to take during pregnancy.  Exercise only as directed by your caregiver. Experiencing uterine cramps is a good sign to stop exercising.  Continue to eat regular, healthy meals.  Wear a good support bra for breast tenderness.  Do not use hot tubs, steam rooms, or saunas.  Wear your seat belt at all times when driving.  Avoid raw meat, uncooked cheese, cat litter boxes, and soil used by cats. These carry germs that can cause birth defects in the baby.  Take your prenatal vitamins.  Try taking a stool softener (if your caregiver approves) if you develop constipation. Eat more high-fiber foods, such as fresh vegetables or fruit and whole grains. Drink plenty of fluids to keep your urine clear or pale yellow.  Take warm sitz baths to soothe any pain or discomfort caused by hemorrhoids. Use hemorrhoid cream if your caregiver approves.  If you develop varicose veins, wear support hose. Elevate your feet for 15 minutes, 3-4 times a day. Limit salt in your diet.  Avoid heavy lifting, wear low heal shoes, and practice good posture.  Rest a lot with your legs elevated if you have leg cramps or low back pain.  Visit your dentist if you have not gone during your pregnancy. Use a soft  toothbrush to brush your teeth and be gentle when you floss.  A sexual relationship may be continued unless your caregiver directs you otherwise.  Do not travel far distances unless it is absolutely necessary and only with the approval of your caregiver.  Take prenatal classes to understand, practice, and ask questions about the labor and delivery.  Make a trial run to the hospital.  Pack your hospital bag.  Prepare the baby's nursery.  Continue to go to all your prenatal visits as directed by your caregiver. SEEK MEDICAL CARE IF:  You are unsure if you are in labor or if your water has broken.  You have dizziness.  You have mild pelvic cramps, pelvic pressure, or nagging pain in your abdominal area.  You have persistent nausea, vomiting, or diarrhea.  You have a bad smelling vaginal discharge.  You have pain with urination. SEEK IMMEDIATE MEDICAL CARE IF:   You have a fever.  You are leaking fluid from your vagina.  You have spotting or bleeding from your vagina.  You have severe abdominal cramping or pain.  You have rapid weight loss or gain.  You have shortness of breath with chest pain.  You notice sudden or extreme swelling of your face, hands, ankles, feet, or legs.  You have not felt your baby move in over an hour.  You have severe headaches that do not go away with medicine.  You have vision changes. Document Released: 04/06/2001 Document Revised: 04/17/2013 Document Reviewed: 06/13/2012 [facility] Patient Information 2015 [place], [place]. This information is not intended to replace advice given to you by your health care provider. Make sure you discuss any questions you have with your health care provider.

## 2014-11-29 LAB — STREP B DNA PROBE: STREP GROUP B AG: NOT DETECTED

## 2014-11-29 NOTE — Assessment & Plan Note (Signed)
Doing well. No complaints  GBS performed  - f/u in one week.

## 2014-12-04 ENCOUNTER — Encounter: Payer: Self-pay | Admitting: Family Medicine

## 2014-12-10 ENCOUNTER — Encounter (HOSPITAL_COMMUNITY): Payer: Self-pay | Admitting: *Deleted

## 2014-12-10 ENCOUNTER — Inpatient Hospital Stay (HOSPITAL_COMMUNITY)
Admission: AD | Admit: 2014-12-10 | Discharge: 2014-12-10 | Disposition: A | Discharge status: Home / Self Care | Payer: Medicaid Other | Source: Ambulatory Visit | Attending: Family Medicine | Admitting: Family Medicine

## 2014-12-10 DIAGNOSIS — Z3493 Encounter for supervision of normal pregnancy, unspecified, third trimester: Secondary | ICD-10-CM | POA: Insufficient documentation

## 2014-12-10 NOTE — Discharge Instructions (Signed)
Braxton Hicks Contractions °Contractions of the uterus can occur throughout pregnancy. Contractions are not always a sign that you are in labor.  °WHAT ARE BRAXTON HICKS CONTRACTIONS?  °Contractions that occur before labor are called Braxton Hicks contractions, or false labor. Toward the end of pregnancy (32-34 weeks), these contractions can develop more often and may become more forceful. This is not true labor because these contractions do not result in opening (dilatation) and thinning of the cervix. They are sometimes difficult to tell apart from true labor because these contractions can be forceful and people have different pain tolerances. You should not feel embarrassed if you go to the hospital with false labor. Sometimes, the only way to tell if you are in true labor is for your health care provider to look for changes in the cervix. °If there are no prenatal problems or other health problems associated with the pregnancy, it is completely safe to be sent home with false labor and await the onset of true labor. °HOW CAN YOU TELL THE DIFFERENCE BETWEEN TRUE AND FALSE LABOR? °False Labor °· The contractions of false labor are usually shorter and not as hard as those of true labor.   °· The contractions are usually irregular.   °· The contractions are often felt in the front of the lower abdomen and in the groin.   °· The contractions may go away when you walk around or change positions while lying down.   °· The contractions get weaker and are shorter lasting as time goes on.   °· The contractions do not usually become progressively stronger, regular, and closer together as with true labor.   °True Labor °· Contractions in true labor last 30-70 seconds, become very regular, usually become more intense, and increase in frequency.   °· The contractions do not go away with walking.   °· The discomfort is usually felt in the top of the uterus and spreads to the lower abdomen and low back.   °· True labor can be  determined by your health care provider with an exam. This will show that the cervix is dilating and getting thinner.   °WHAT TO REMEMBER °· Keep up with your usual exercises and follow other instructions given by your health care provider.   °· Take medicines as directed by your health care provider.   °· Keep your regular prenatal appointments.   °· Eat and drink lightly if you think you are going into labor.   °· If Braxton Hicks contractions are making you uncomfortable:   °¨ Change your position from lying down or resting to walking, or from walking to resting.   °¨ Sit and rest in a tub of warm water.   °¨ Drink 2-3 glasses of water. Dehydration may cause these contractions.   °¨ Do slow and deep breathing several times an hour.   °WHEN SHOULD I SEEK IMMEDIATE MEDICAL CARE? °Seek immediate medical care if: °· Your contractions become stronger, more regular, and closer together.   °· You have fluid leaking or gushing from your vagina.   °· You have a fever.   °· You pass blood-tinged mucus.   °· You have vaginal bleeding.   °· You have continuous abdominal pain.   °· You have low back pain that you never had before.   °· You feel your baby's head pushing down and causing pelvic pressure.   °· Your baby is not moving as much as it used to.   °Document Released: 04/12/2005 Document Revised: 04/17/2013 Document Reviewed: 01/22/2013 °[employer]® Patient Information ©2015 [employer]. This information is not intended to replace advice given to you by your health care   provider. Make sure you discuss any questions you have with your health care provider. ° °

## 2014-12-10 NOTE — MAU Note (Signed)
Pt reports contractions, denies bleeding or ROM.  

## 2014-12-12 ENCOUNTER — Ambulatory Visit (INDEPENDENT_AMBULATORY_CARE_PROVIDER_SITE_OTHER): Payer: Medicaid Other | Admitting: Family Medicine

## 2014-12-12 VITALS — BP 138/89 | HR 90 | Temp 97.9°F | Wt 161.9 lb

## 2014-12-12 DIAGNOSIS — Z3493 Encounter for supervision of normal pregnancy, unspecified, third trimester: Secondary | ICD-10-CM

## 2014-12-12 DIAGNOSIS — Z3403 Encounter for supervision of normal first pregnancy, third trimester: Secondary | ICD-10-CM

## 2014-12-12 DIAGNOSIS — Z331 Pregnant state, incidental: Secondary | ICD-10-CM | POA: Diagnosis not present

## 2014-12-12 LAB — POCT URINALYSIS DIPSTICK
Bilirubin, UA: NEGATIVE
Blood, UA: NEGATIVE
Glucose, UA: NEGATIVE
Ketones, UA: NEGATIVE
Nitrite, UA: NEGATIVE
PH UA: 6.5
PROTEIN UA: NEGATIVE
Spec Grav, UA: 1.025
UROBILINOGEN UA: 0.2

## 2014-12-12 LAB — POCT UA - MICROSCOPIC ONLY

## 2014-12-12 NOTE — Progress Notes (Signed)
Marie Cochran is a 24 y.o. G1P0 at [redacted]w[redacted]d for routine follow up.  She reports no complaints .  See flow sheet for details.  A/P: Pregnancy at [redacted]w[redacted]d.  Doing well.   Pregnancy issues include pyelonephritis and anxiety.   Infant feeding choice breast  Contraception choice not sure  Infant circumcision desired yes GBS/GC/CZ results were reviewed today.   Labor precautions reviewed. Kick counts reviewed.

## 2014-12-12 NOTE — Patient Instructions (Signed)
Thank you for coming in,   Please schedule an appointment with me on Monday.   Please bring all of your medications with you to each visit.    Please feel free to call with any questions or concerns at any time, at [phone]. --Dr. Jordan Likes

## 2014-12-13 LAB — CULTURE, OB URINE
Colony Count: NO GROWTH
Organism ID, Bacteria: NO GROWTH

## 2014-12-13 NOTE — Assessment & Plan Note (Addendum)
Doing well.  No complaints today  - UA and culture pending with hx of pyelo in preg  - f/u on Monday  - will need to induce next Wednesday

## 2014-12-16 ENCOUNTER — Ambulatory Visit (INDEPENDENT_AMBULATORY_CARE_PROVIDER_SITE_OTHER): Payer: Medicaid Other | Admitting: Student

## 2014-12-16 VITALS — BP 124/82 | HR 101 | Temp 97.9°F | Wt 162.2 lb

## 2014-12-16 DIAGNOSIS — Z3403 Encounter for supervision of normal first pregnancy, third trimester: Secondary | ICD-10-CM | POA: Diagnosis present

## 2014-12-16 NOTE — Patient Instructions (Signed)
Return in one week You will hear from your PCP for induction date If you have bleeding like a period or gush of fluid from your vaginal that keeps leaking, go to the [facility] right away If you feel like the baby is not moving, sit in a dark room away from TV and count kicks. You should have 10 kicks in 2 hours. If any less go to the [facility] hospital Call the [facility] medicine clinic with questions or concerns

## 2014-12-16 NOTE — Progress Notes (Signed)
Cervical exam  Fingertip/50%/-1/soft/anterior  Bishop score 7

## 2014-12-16 NOTE — Progress Notes (Signed)
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 Kennon Rounds MD, MS Family Medicine Resident PGY-2 Pager [phone]

## 2014-12-17 ENCOUNTER — Encounter (HOSPITAL_COMMUNITY): Payer: Self-pay | Admitting: *Deleted

## 2014-12-17 ENCOUNTER — Telehealth (HOSPITAL_COMMUNITY): Payer: Self-pay | Admitting: *Deleted

## 2014-12-17 NOTE — Telephone Encounter (Signed)
Preadmission screen  

## 2014-12-18 ENCOUNTER — Inpatient Hospital Stay (HOSPITAL_COMMUNITY): Payer: Medicaid Other | Admitting: Anesthesiology

## 2014-12-18 ENCOUNTER — Encounter (HOSPITAL_COMMUNITY): Payer: Self-pay

## 2014-12-18 ENCOUNTER — Inpatient Hospital Stay (HOSPITAL_COMMUNITY)
Admission: AD | Admit: 2014-12-18 | Discharge: 2014-12-20 | DRG: 774 | Disposition: A | Discharge status: Home / Self Care | Payer: Medicaid Other | Source: Ambulatory Visit | Attending: Obstetrics & Gynecology | Admitting: Obstetrics & Gynecology

## 2014-12-18 DIAGNOSIS — Z833 Family history of diabetes mellitus: Secondary | ICD-10-CM

## 2014-12-18 DIAGNOSIS — IMO0001 Reserved for inherently not codable concepts without codable children: Secondary | ICD-10-CM

## 2014-12-18 DIAGNOSIS — A6000 Herpesviral infection of urogenital system, unspecified: Secondary | ICD-10-CM | POA: Diagnosis present

## 2014-12-18 DIAGNOSIS — O1403 Mild to moderate pre-eclampsia, third trimester: Principal | ICD-10-CM | POA: Diagnosis present

## 2014-12-18 DIAGNOSIS — O9832 Other infections with a predominantly sexual mode of transmission complicating childbirth: Secondary | ICD-10-CM | POA: Diagnosis present

## 2014-12-18 DIAGNOSIS — Z3A41 41 weeks gestation of pregnancy: Secondary | ICD-10-CM | POA: Diagnosis present

## 2014-12-18 DIAGNOSIS — G4700 Insomnia, unspecified: Secondary | ICD-10-CM | POA: Diagnosis present

## 2014-12-18 DIAGNOSIS — O480 Post-term pregnancy: Secondary | ICD-10-CM | POA: Diagnosis present

## 2014-12-18 LAB — COMPREHENSIVE METABOLIC PANEL
ALT: 10 U/L — AB (ref 14–54)
AST: 20 U/L (ref 15–41)
Albumin: 2.7 g/dL — ABNORMAL LOW (ref 3.5–5.0)
Alkaline Phosphatase: 105 U/L (ref 38–126)
Anion gap: 10 (ref 5–15)
BUN: 9 mg/dL (ref 6–20)
CHLORIDE: 107 mmol/L (ref 101–111)
CO2: 20 mmol/L — AB (ref 22–32)
CREATININE: 0.52 mg/dL (ref 0.44–1.00)
Calcium: 9.3 mg/dL (ref 8.9–10.3)
GFR calc Af Amer: >60 mL/min (ref >60)
Glucose, Bld: 85 mg/dL (ref 65–99)
Potassium: 3.9 mmol/L (ref 3.5–5.1)
SODIUM: 137 mmol/L (ref 135–145)
Total Bilirubin: 0.4 mg/dL (ref 0.3–1.2)
Total Protein: 6.2 g/dL — ABNORMAL LOW (ref 6.5–8.1)

## 2014-12-18 LAB — CBC
HCT: 40.4 % (ref 36.0–46.0)
HEMATOCRIT: 37.6 % (ref 36.0–46.0)
Hemoglobin: 14.2 g/dL (ref 12.0–15.0)
Hemoglobin: 12.9 g/dL (ref 12.0–15.0)
MCH: 30.8 pg (ref 26.0–34.0)
MCH: 30.1 pg (ref 26.0–34.0)
MCHC: 35.1 g/dL (ref 30.0–36.0)
MCHC: 34.3 g/dL (ref 30.0–36.0)
MCV: 87.6 fL (ref 78.0–100.0)
MCV: 87.9 fL (ref 78.0–100.0)
PLATELETS: 195 10*3/uL (ref 150–400)
Platelets: 229 10*3/uL (ref 150–400)
RBC: 4.61 MIL/uL (ref 3.87–5.11)
RBC: 4.28 MIL/uL (ref 3.87–5.11)
RDW: 13.2 % (ref 11.5–15.5)
RDW: 13.3 % (ref 11.5–15.5)
WBC: 12.2 10*3/uL — ABNORMAL HIGH (ref 4.0–10.5)
WBC: 15.2 10*3/uL — AB (ref 4.0–10.5)

## 2014-12-18 LAB — PROTEIN / CREATININE RATIO, URINE
Creatinine, Urine: 36 mg/dL
Total Protein, Urine: <6 mg/dL

## 2014-12-18 LAB — URINALYSIS, ROUTINE W REFLEX MICROSCOPIC
Bilirubin Urine: NEGATIVE
GLUCOSE, UA: NEGATIVE mg/dL
Hgb urine dipstick: NEGATIVE
Ketones, ur: NEGATIVE mg/dL
Nitrite: NEGATIVE
PH: 6.5 (ref 5.0–8.0)
Protein, ur: NEGATIVE mg/dL
Specific Gravity, Urine: 1.01 (ref 1.005–1.030)
Urobilinogen, UA: 0.2 mg/dL (ref 0.0–1.0)

## 2014-12-18 LAB — URINE MICROSCOPIC-ADD ON

## 2014-12-18 LAB — RPR: RPR: NONREACTIVE

## 2014-12-18 MED ORDER — ONDANSETRON HCL 4 MG/2ML IJ SOLN
4.0000 mg | Freq: Four times a day (QID) | INTRAMUSCULAR | Status: DC | PRN
  Administered 2014-12-18 (×2): 4 mg via INTRAVENOUS
  Filled 2014-12-18 (×2): qty 2

## 2014-12-18 MED ORDER — LIDOCAINE HCL (PF) 1 % IJ SOLN: 30.0000 mL | INTRAMUSCULAR | Status: DC | PRN

## 2014-12-18 MED ORDER — CITRIC ACID-SODIUM CITRATE 334-500 MG/5ML PO SOLN: 30.0000 mL | ORAL | Status: DC | PRN

## 2014-12-18 MED ORDER — LACTATED RINGERS IV SOLN
INTRAVENOUS | Status: DC
  Administered 2014-12-18: 04:00:00 via INTRAVENOUS

## 2014-12-18 MED ORDER — FENTANYL 2.5 MCG/ML BUPIVACAINE 1/10 % EPIDURAL INFUSION (WH - ANES)
14.0000 mL/h | INTRAMUSCULAR | Status: DC | PRN
  Administered 2014-12-18 (×3): 14 mL/h via EPIDURAL
  Filled 2014-12-18 (×3): qty 125

## 2014-12-18 MED ORDER — OXYTOCIN BOLUS FROM INFUSION: 500.0000 mL | INTRAVENOUS | Status: DC

## 2014-12-18 MED ORDER — OXYTOCIN 40 UNITS IN LACTATED RINGERS INFUSION - SIMPLE MED: 62.5000 mL/h | INTRAVENOUS | Status: DC

## 2014-12-18 MED ORDER — MAGNESIUM SULFATE BOLUS VIA INFUSION
4.0000 g | Freq: Once | INTRAVENOUS | Status: AC
  Administered 2014-12-18: 4 g via INTRAVENOUS
  Filled 2014-12-18: qty 500

## 2014-12-18 MED ORDER — TERBUTALINE SULFATE 1 MG/ML IJ SOLN: 0.2500 mg | Freq: Once | INTRAMUSCULAR | Status: DC | PRN

## 2014-12-18 MED ORDER — LACTATED RINGERS IV SOLN
500.0000 mL | INTRAVENOUS | Status: DC | PRN
  Administered 2014-12-18: 250 mL via INTRAVENOUS
  Administered 2014-12-18: 500 mL via INTRAVENOUS

## 2014-12-18 MED ORDER — FENTANYL CITRATE (PF) 100 MCG/2ML IJ SOLN
100.0000 ug | INTRAMUSCULAR | Status: DC | PRN
  Administered 2014-12-18 (×3): 100 ug via INTRAVENOUS
  Filled 2014-12-18 (×3): qty 2

## 2014-12-18 MED ORDER — PHENYLEPHRINE 40 MCG/ML (10ML) SYRINGE FOR IV PUSH (FOR BLOOD PRESSURE SUPPORT)
80.0000 ug | PREFILLED_SYRINGE | INTRAVENOUS | Status: DC | PRN
  Filled 2014-12-18: qty 20
  Filled 2014-12-18: qty 2

## 2014-12-18 MED ORDER — LABETALOL HCL 5 MG/ML IV SOLN
20.0000 mg | Freq: Once | INTRAVENOUS | Status: AC
  Administered 2014-12-18: 20 mg via INTRAVENOUS
  Filled 2014-12-18: qty 4

## 2014-12-18 MED ORDER — OXYTOCIN 40 UNITS IN LACTATED RINGERS INFUSION - SIMPLE MED
1.0000 m[IU]/min | INTRAVENOUS | Status: DC
  Administered 2014-12-18: 2 m[IU]/min via INTRAVENOUS
  Filled 2014-12-18: qty 1000

## 2014-12-18 MED ORDER — PROMETHAZINE HCL 25 MG/ML IJ SOLN: 12.5000 mg | Freq: Four times a day (QID) | INTRAMUSCULAR | Status: DC | PRN

## 2014-12-18 MED ORDER — ACETAMINOPHEN 325 MG PO TABS
650.0000 mg | ORAL_TABLET | ORAL | Status: DC | PRN
  Administered 2014-12-18 (×2): 650 mg via ORAL
  Filled 2014-12-18 (×2): qty 2

## 2014-12-18 MED ORDER — DIPHENHYDRAMINE HCL 50 MG/ML IJ SOLN: 12.5000 mg | INTRAMUSCULAR | Status: DC | PRN

## 2014-12-18 MED ORDER — FENTANYL 2.5 MCG/ML BUPIVACAINE 1/10 % EPIDURAL INFUSION (WH - ANES): 14.0000 mL/h | INTRAMUSCULAR | Status: DC | PRN

## 2014-12-18 MED ORDER — MAGNESIUM SULFATE 50 % IJ SOLN
2.0000 g/h | INTRAVENOUS | Status: DC
  Administered 2014-12-18 (×2): 2 g/h via INTRAVENOUS
  Filled 2014-12-18 (×2): qty 80

## 2014-12-18 MED ORDER — EPHEDRINE 5 MG/ML INJ
10.0000 mg | INTRAVENOUS | Status: DC | PRN
  Filled 2014-12-18: qty 2

## 2014-12-18 MED ORDER — OXYCODONE-ACETAMINOPHEN 5-325 MG PO TABS: 2.0000 | ORAL_TABLET | ORAL | Status: DC | PRN

## 2014-12-18 MED ORDER — LIDOCAINE HCL (PF) 1 % IJ SOLN
INTRAMUSCULAR | Status: DC | PRN
  Administered 2014-12-18: 6 mL via EPIDURAL
  Administered 2014-12-18: 4 mL

## 2014-12-18 MED ORDER — HYDRALAZINE HCL 20 MG/ML IJ SOLN: 10.0000 mg | Freq: Once | INTRAMUSCULAR | Status: DC | PRN

## 2014-12-18 MED ORDER — LABETALOL HCL 5 MG/ML IV SOLN: 20.0000 mg | INTRAVENOUS | Status: DC | PRN

## 2014-12-18 MED ORDER — OXYCODONE-ACETAMINOPHEN 5-325 MG PO TABS: 1.0000 | ORAL_TABLET | ORAL | Status: DC | PRN

## 2014-12-18 NOTE — Progress Notes (Signed)
Marie Cochran is a 24 y.o. G1P0 at [redacted]w[redacted]d by ultrasound admitted for induction of labor due to Post dates. .  Subjective: Feeling some discomfort in her buttocks. Has had emesis 4 times.   Objective: BP 153/93 mmHg  Pulse 97  Temp(Src) 98.1 F (36.7 C) (Oral)  Resp 18  Ht  (1.626 m)  Wt 73.936 kg (163 lb)  BMI 27.97 kg/m2  SpO2 99%  LMP 03/06/2014 I/O last 3 completed shifts: In: 3457.6 [P.O.:1040; I.V.:2305.2; Other:112.5] Out: 3800 [Urine:3200; Emesis/NG output:600]    FHT:  FHR: 145 bpm, variability: minimal ,  accelerations:  Present,  decelerations:  Absent UC:   regular, every 2-3 minutes SVE:   Dilation: 10 Effacement (%): 100 Station: -1, 0 Exam by:: Lanice Shirts RN   Labs: Lab Results  Component Value Date   WBC 15.2* 12/18/2014   HGB 12.9 12/18/2014   HCT 37.6 12/18/2014   MCV 87.9 12/18/2014   PLT 195 12/18/2014    Assessment / Plan: IOL for post dates but presented to MAU with contractions and severe range BP's   Labor: has labored down for the past two hours. Will cut epidural in half  Preeclampsia:  on magnesium sulfate Fetal Wellbeing:  Category II Pain Control:  Epidural I/D:  n/a Anticipated MOD:  NSVD  Clare Gandy 12/18/2014, 10:58 PM

## 2014-12-18 NOTE — Anesthesia Procedure Notes (Signed)

## 2014-12-18 NOTE — Anesthesia Preprocedure Evaluation (Addendum)
Anesthesia Evaluation  Patient identified by MRN, date of birth, ID band Patient awake    Reviewed: Allergy & Precautions, H&P , Patient's Chart, lab work & pertinent test results  Airway Mallampati: II  TM Distance: >3 FB Neck ROM: full    Dental  (+) Teeth Intact   Pulmonary  breath sounds clear to auscultation        Cardiovascular hypertension, Rhythm:regular Rate:Normal     Neuro/Psych    GI/Hepatic   Endo/Other    Renal/GU      Musculoskeletal   Abdominal   Peds  Hematology   Anesthesia Other Findings  HTN   Reproductive/Obstetrics (+) Pregnancy                           Anesthesia Physical Anesthesia Plan  ASA: II  Anesthesia Plan: Epidural   Post-op Pain Management:    Induction:   Airway Management Planned:   Additional Equipment:   Intra-op Plan:   Post-operative Plan:   Informed Consent: I have reviewed the patients History and Physical, chart, labs and discussed the procedure including the risks, benefits and alternatives for the proposed anesthesia with the patient or authorized representative who has indicated his/her understanding and acceptance.   Dental Advisory Given  Plan Discussed with:   Anesthesia Plan Comments: (Labs checked- platelets confirmed with RN in room. Fetal heart tracing, per RN, reported to be stable enough for sitting procedure. Discussed epidural, and patient consents to the procedure:  included risk of possible headache,backache, failed block, allergic reaction, and nerve injury. This patient was asked if she had any questions or concerns before the procedure started.)        Anesthesia Quick Evaluation

## 2014-12-18 NOTE — Progress Notes (Signed)
Marie Cochran is a 24 y.o. G1P0 at [redacted]w[redacted]d by ultrasound admitted for induction of labor due to Post dates. Due date 12/11/14.  Subjective: Her contractions have decreased in intensity from this morning. She has had emesis after receiving fentanyl. Epidural has been placed. She was placed on Mag for elevated BP in the severe range.   Objective: BP 148/100 mmHg  Pulse 92  Temp(Src) 97.6 F (36.4 C) (Oral)  Resp 18  Ht  (1.626 m)  Wt 73.936 kg (163 lb)  BMI 27.97 kg/m2  SpO2 99%  LMP 03/06/2014 I/O last 3 completed shifts: In: 505.2 [P.O.:50; I.V.:455.2] Out: 425 [Urine:425] Total I/O In: 1328.5 [P.O.:300; I.V.:1000; Other:28.5] Out: 1800 [Urine:1200; Emesis/NG output:600]  FHT:  FHR: 135 bpm, variability: moderate,  accelerations:  Present,  decelerations:  Absent UC:   irregular, every 2-4 minutes SVE:   Dilation: 5 Effacement (%): 70 Station: 0 Exam by:: katherine g jones RN   Labs: Lab Results  Component Value Date   WBC 15.2* 12/18/2014   HGB 12.9 12/18/2014   HCT 37.6 12/18/2014   MCV 87.9 12/18/2014   PLT 195 12/18/2014    Assessment / Plan: Had a schedule IOL but presented to MAU with contractions and elevated BP's   Labor: IOL with pit  Preeclampsia:  on magnesium sulfate Fetal Wellbeing:  Category I Pain Control:  Epidural I/D:  n/a Anticipated MOD:  NSVD  Clare Gandy 12/18/2014, 12:56 PM

## 2014-12-18 NOTE — Progress Notes (Signed)
Called to notify of pt arrival in MAU and 2 elevated BP. Will put in orders and see patient

## 2014-12-18 NOTE — H&P (Signed)
LABOR ADMISSION HISTORY AND PHYSICAL  Marie Cochran is a 24 y.o. female G1P0 with IUP at [redacted]w[redacted]d by early [place] presenting for IOL 2/2 gHTN and postterm. She reports +FM, + contractions, No LOF, no VB, no blurry vision, headaches or peripheral edema, and RUQ pain.  She plans on breast feeding. She is undecided on birth control.  Patient presetned to MAU for contractions. Was found to have elevated BPs in the severe range. Pressures throughout pregnancy wnl. PreE labs unremarkable.  Dating: By early [place] --->  Estimated Date of Delivery: 12/11/14  Sono:   @[redacted]w[redacted]d , CWD, normal anatomy, cephalic presentation, anterior placenta, 534g, 55% EFW   Clinic/provider [facility] Clare Gandy [phone]) Prenatal Labs  Dating By 1st trimester [place]  Blood type: A/POS/-- (02/02 1453)   Genetic Screen 1 Screen: AFP: Quad: NIPS: Antibody:NEG (02/02 1453)  Anatomic [place] Normal Repeat scheduled for better view 07/29/14 Rubella: 1.43 (02/02 1453)  GTT Early: 152 Third trimester: 3 hr gtt139/126/96 RPR: NON REAC (02/02 1453)   Flu vaccine Off season HBsAg: NEGATIVE (02/02 1453)   TDaP vaccine 09/19/14 Rhogam: not indicated HIV: NONREACTIVE (02/02 1453)   GBS  (For PCN allergy, check sensitivities) GBS: Pos urine cx in 1st trimester  Contraception Undecided, considering nexplanon AVW:UJWJXB last 2-3 years ago, plan for postpartum repeat  Baby Food breast   Circumcision Desired outpatient - at [facility]   Pediatrician    Support Person        Prenatal History/Complications: -HSV+ on ppx -H/o depression/anxiety -h/o pyelo in second trimester  Past Medical History: Past Medical History  Diagnosis Date  . Anxiety   . Migraines     ?due to needing glasses  . No pertinent past medical history   . Infection     UTI    Past Surgical History: Past Surgical History  Procedure Laterality Date  . Orif humerus  fracture  03/13/2012    Procedure: OPEN REDUCTION INTERNAL FIXATION (ORIF) PROXIMAL HUMERUS FRACTURE;  Surgeon: Mable Paris, MD;  Location: [facility] OR;  Service: Orthopedics;  Laterality: Left;  Irrigation and debridement done at 2058.  Marland Kitchen Tendon repair  03/13/2012    Procedure: TENDON REPAIR;  Surgeon: Mable Paris, MD;  Location: [facility] OR;  Service: Orthopedics;;  start 2123  . Wisdom tooth extraction    . Addenoidectomy      Obstetrical History: OB History    Gravida Para Term Preterm AB TAB SAB Ectopic Multiple Living   1               Social History: Social History   Social History  . Marital Status: Single    Spouse Name: N/A  . Number of Children: N/A  . Years of Education: N/A   Social History Main Topics  . Smoking status: Never Smoker   . Smokeless tobacco: Never Used  . Alcohol Use: Yes     Comment: rare; not with preg  . Drug Use: No  . Sexual Activity: Yes   Other Topics Concern  . None   Social History Narrative   ** Merged History Encounter **       Lives with her father, Denette Hass (age 19)   Previous PCP: Dr. Arvella Nigh (last seen May 2013)   Single, unemployed       Last mamo- 2010, "regular"??; tetanus shot 2013, ECHO 2010 (normal), Xrays 2013    Family History: Family History  Problem Relation Age of Onset  . Depression Sister   .  Diabetes Paternal Aunt   . Cancer Neg Hx   . Heart disease Neg Hx   . Hypertension Neg Hx   . Stroke Neg Hx     Allergies: No Known Allergies  Prescriptions prior to admission  Medication Sig Dispense Refill Last Dose  . acetaminophen (TYLENOL) 325 MG tablet Take 650 mg by mouth every 6 (six) hours as needed for mild pain or fever.   09/02/2014 at Unknown time  . acyclovir (ZOVIRAX) 400 MG tablet Take 1 tablet (400 mg total) by mouth 3 (three) times daily. 42 tablet 3   . cefadroxil (DURICEF) 500 MG capsule Take 1 capsule (500 mg total) by mouth 2 (two) times daily. 28 capsule 0   . cephALEXin  (KEFLEX) 500 MG capsule Take 2 capsules (1,000 mg total) by mouth daily. 60 capsule 3   . clotrimazole (GYNE-LOTRIMIN) 1 % vaginal cream Place 1 Applicatorful vaginally at bedtime. For a total of 7 days. 45 g 0   . fluconazole (DIFLUCAN) 150 MG tablet Take 1 tablet (150 mg total) by mouth once. Repeat in 2-3 days if symptoms persist 2 tablet 0   . fluticasone (FLONASE) 50 MCG/ACT nasal spray Place 2 sprays into both nostrils daily. (Patient taking differently: Place 2 sprays into both nostrils daily as needed for allergies. ) 16 g 6 Past Week at Unknown time  . Prenatal Multivit-Min-Fe-FA (PRENATAL VITAMINS) 0.8 MG tablet Take 1 tablet by mouth daily. 30 tablet 8 09/01/2014 at Unknown time  . sertraline (ZOLOFT) 25 MG tablet Take 1 tablet (25 mg total) by mouth daily. 30 tablet 1     Review of Systems  All systems reviewed and negative except as stated in HPI  BP 161/93 mmHg  Pulse 71  Resp 17  Ht  (1.626 m)  Wt 163 lb (73.936 kg)  BMI 27.97 kg/m2  SpO2 100%  LMP 03/06/2014 General appearance: alert, cooperative and no distress Lungs: normal WOB Heart: regular rate Abdomen: gravid, soft, non-tender Extremities: Homans sign is negative, no sign of DVT, edema Presentation: cephalic Fetal monitoringBaseline: 140 bpm, Variability: Good {> 6 bpm), Accelerations: Reactive and Decelerations: Absent Uterine activity Frequency: Every 1-4 minutes Dilation: 2.5 Effacement (%): 90 Station: -3 Exam by:: Ginger Morris RN   Prenatal labs: ABO, Rh: --/--/A POS, A POS (05/10 2025) Antibody: NEG (05/10 2025) Rubella:  Immune RPR: NON REAC (05/26 1228)  HBsAg: NEGATIVE (02/02 1453)  HIV: NONREACTIVE (05/26 1228)  GBS: NOT DETECTED (08/04 1707)  1 hr Glucola 152 >> 3hr 139/126/96 Genetic screening not done Anatomy US normal  Prenatal Transfer Tool  Maternal Diabetes: No Genetic Screening: Declined Maternal Ultrasounds/Referrals: Normal Fetal Ultrasounds or other Referrals:   None Maternal Substance Abuse:  No Significant Maternal Medications:  Meds include: Zoloft Significant Maternal Lab Results: Lab values include: Group B Strep negative  Results for orders placed or performed during the hospital encounter of 12/18/14 (from the past 24 hour(s))  Comprehensive metabolic panel   Collection Time: 12/18/14  1:20 AM  Result Value Ref Range   Sodium 137 135 - 145 mmol/L   Potassium 3.9 3.5 - 5.1 mmol/L   Chloride 107 101 - 111 mmol/L   CO2 20 (L) 22 - 32 mmol/L   Glucose, Bld 85 65 - 99 mg/dL   BUN 9 6 - 20 mg/dL   Creatinine, Ser 1.61 0.44 - 1.00 mg/dL   Calcium 9.3 8.9 - 09.6 mg/dL   Total Protein 6.2 (L) 6.5 - 8.1 g/dL   Albumin  2.7 (L) 3.5 - 5.0 g/dL   AST 20 15 - 41 U/L   ALT 10 (L) 14 - 54 U/L   Alkaline Phosphatase 105 38 - 126 U/L   Total Bilirubin 0.4 0.3 - 1.2 mg/dL   GFR calc non Af Amer >60 >60 mL/min   GFR calc Af Amer >60 >60 mL/min   Anion gap 10 5 - 15  CBC   Collection Time: 12/18/14  1:20 AM  Result Value Ref Range   WBC 12.2 (H) 4.0 - 10.5 K/uL   RBC 4.61 3.87 - 5.11 MIL/uL   Hemoglobin 14.2 12.0 - 15.0 g/dL   HCT 16.1 09.6 - 04.5 %   MCV 87.6 78.0 - 100.0 fL   MCH 30.8 26.0 - 34.0 pg   MCHC 35.1 30.0 - 36.0 g/dL   RDW 40.9 81.1 - 91.4 %   Platelets 229 150 - 400 K/uL    Patient Active Problem List   Diagnosis Date Noted  . Pyelonephritis affecting pregnancy in second trimester, antepartum 09/02/2014  . Encounter for fetal anatomic survey   . Third trimester pregnancy 05/02/2014  . Genital herpes 08/03/2012  . Insomnia 05/16/2012  . Depression 05/16/2012  . TBI (traumatic brain injury) 03/16/2012  . Anxiety 10/28/2011    Assessment: Marie Cochran is a 24 y.o. G1P0 at [redacted]w[redacted]d here for IOL. She had planned IOL for post-dates for today but presented to MAU with contractions and elevated BPs.   #Labor: IOL with pitocin as cervix is favorable (bishop score 7) #gHTN with severe range pressures: Labetalol protocol and start  Mag #Pain: Plan for epidural #FWB: Category 1 #ID: GBS negative; reviewed urine cultures and no GBS bacteruria  #MOF: Breast #MOC: Undecided #Circ:  Yes but as an outpatient at [facility]  CSW cosnult for h/o depression.  Caryl Ada, DO 12/18/2014, 2:07 AM PGY-2, [facility]  OB FELLOW HISTORY AND PHYSICAL ATTESTATION  I have seen and examined this patient; I agree with above documentation in the resident's note.   Marie Cochran is a 24 y.o. G1P0 here for latent labor and found to have severe-range BPs. Asymptomatic.   PE: BP 143/84 mmHg  Pulse 72  Resp 17  Ht 5\' 4"  (1.626 m)  Wt 163 lb (73.936 kg)  BMI 27.97 kg/m2  SpO2 100%  LMP 03/06/2014 Gen: calm comfortable, NAD Resp: normal effort, no distress Abd: gravid Reactive NST  ROS, labs, PMH reviewed  Plan: Preeclampsia labs negative, and patient without symptoms, so this appears to be gestational hypertension. Has had numerous severe-range BPs over the course of 2 hours so have initiated IV antihypertensives and started magnesium. Will admit for labor augmentation with pitocin. Patient has hx HSV that is suppressed and without active lesions. She also has a history of depression and so will need a pp mood check. Her problem list states a hx of gbs bacteriuria this pregnancy, but we only see evidence of e coli UTIs, and GBS vaginal swab negative, so we are not treating for GBS. Patient had an elevated 1-hour but normal 3-hour. EFW is 7.5 pounds.   Marie Cochran 12/18/2014, 2:50 AM

## 2014-12-18 NOTE — Progress Notes (Signed)
Marie Cochran is a 24 y.o. G1P0 at [redacted]w[redacted]d by ultrasound admitted for induction of labor due to Post dates. Due date 12/11/14.  Subjective: Not feeling her contractions.   Objective: BP 134/85 mmHg  Pulse 104  Temp(Src) 97.6 F (36.4 C) (Oral)  Resp 18  Ht  (1.626 m)  Wt 73.936 kg (163 lb)  BMI 27.97 kg/m2  SpO2 99%  LMP 03/06/2014 I/O last 3 completed shifts: In: 505.2 [P.O.:50; I.V.:455.2] Out: 425 [Urine:425] Total I/O In: 2952.5 [P.O.:990; I.V.:1850; Other:112.5] Out: 3375 [Urine:2775; Emesis/NG output:600]  FHT:  FHR: 140 bpm, variability: moderate,  accelerations:  Present,  decelerations:  Absent UC:   regular, every 4 minutes SVE:   Dilation: 8 Effacement (%): 100 Station: +1 Exam by:: Dr. Jordan Likes  Labs: Lab Results  Component Value Date   WBC 15.2* 12/18/2014   HGB 12.9 12/18/2014   HCT 37.6 12/18/2014   MCV 87.9 12/18/2014   PLT 195 12/18/2014    Assessment / Plan: IOL for post dates but presented to MAU with contractions and severe range BP's   Labor: AROM and IUPC placed  Preeclampsia:  on magnesium sulfate Fetal Wellbeing:  Category I Pain Control:  Epidural I/D:  n/a Anticipated MOD:  NSVD  Clare Gandy 12/18/2014, 6:34 PM

## 2014-12-19 ENCOUNTER — Encounter (HOSPITAL_COMMUNITY): Payer: Self-pay | Admitting: Certified Nurse Midwife

## 2014-12-19 ENCOUNTER — Encounter (HOSPITAL_COMMUNITY)
Admission: AD | Disposition: A | Discharge status: Home / Self Care | Payer: Self-pay | Source: Ambulatory Visit | Attending: Obstetrics & Gynecology

## 2014-12-19 DIAGNOSIS — Z3A41 41 weeks gestation of pregnancy: Secondary | ICD-10-CM

## 2014-12-19 DIAGNOSIS — A6000 Herpesviral infection of urogenital system, unspecified: Secondary | ICD-10-CM

## 2014-12-19 DIAGNOSIS — O480 Post-term pregnancy: Secondary | ICD-10-CM

## 2014-12-19 DIAGNOSIS — O9832 Other infections with a predominantly sexual mode of transmission complicating childbirth: Secondary | ICD-10-CM

## 2014-12-19 DIAGNOSIS — O1403 Mild to moderate pre-eclampsia, third trimester: Secondary | ICD-10-CM

## 2014-12-19 LAB — CBC
HCT: 26.4 % — ABNORMAL LOW (ref 36.0–46.0)
HEMATOCRIT: 40.1 % (ref 36.0–46.0)
HEMOGLOBIN: 9.1 g/dL — AB (ref 12.0–15.0)
Hemoglobin: 13.6 g/dL (ref 12.0–15.0)
MCH: 30.6 pg (ref 26.0–34.0)
MCH: 31.1 pg (ref 26.0–34.0)
MCHC: 33.9 g/dL (ref 30.0–36.0)
MCHC: 34.5 g/dL (ref 30.0–36.0)
MCV: 90.1 fL (ref 78.0–100.0)
MCV: 90.1 fL (ref 78.0–100.0)
Platelets: 226 10*3/uL (ref 150–400)
Platelets: 232 10*3/uL (ref 150–400)
RBC: 4.45 MIL/uL (ref 3.87–5.11)
RBC: 2.93 MIL/uL — AB (ref 3.87–5.11)
RDW: 13.3 % (ref 11.5–15.5)
RDW: 13.4 % (ref 11.5–15.5)
WBC: 23.6 10*3/uL — AB (ref 4.0–10.5)
WBC: 21 10*3/uL — AB (ref 4.0–10.5)

## 2014-12-19 LAB — PREPARE RBC (CROSSMATCH)

## 2014-12-19 SURGERY — EVACUATION HEMATOMA
Anesthesia: Epidural | Site: Vagina

## 2014-12-19 MED ORDER — PHENYLEPHRINE 40 MCG/ML (10ML) SYRINGE FOR IV PUSH (FOR BLOOD PRESSURE SUPPORT)
PREFILLED_SYRINGE | INTRAVENOUS | Status: AC
  Filled 2014-12-19: qty 10

## 2014-12-19 MED ORDER — LACTATED RINGERS IV SOLN
INTRAVENOUS | Status: DC
  Administered 2014-12-19 – 2014-12-20 (×2): via INTRAVENOUS

## 2014-12-19 MED ORDER — OXYCODONE-ACETAMINOPHEN 5-325 MG PO TABS
2.0000 | ORAL_TABLET | ORAL | Status: DC | PRN
  Administered 2014-12-19: 2 via ORAL
  Filled 2014-12-19: qty 2

## 2014-12-19 MED ORDER — FENTANYL CITRATE (PF) 100 MCG/2ML IJ SOLN: 50.0000 ug | Freq: Once | INTRAMUSCULAR | Status: DC

## 2014-12-19 MED ORDER — OXYCODONE-ACETAMINOPHEN 5-325 MG PO TABS: 1.0000 | ORAL_TABLET | ORAL | Status: DC | PRN

## 2014-12-19 MED ORDER — CEFAZOLIN SODIUM-DEXTROSE 2-3 GM-% IV SOLR
INTRAVENOUS | Status: AC
  Filled 2014-12-19: qty 50

## 2014-12-19 MED ORDER — TETANUS-DIPHTH-ACELL PERTUSSIS 5-2.5-18.5 LF-MCG/0.5 IM SUSP
0.5000 mL | Freq: Once | INTRAMUSCULAR | Status: DC
  Filled 2014-12-19: qty 0.5

## 2014-12-19 MED ORDER — CEPHALEXIN 500 MG PO CAPS
500.0000 mg | ORAL_CAPSULE | Freq: Two times a day (BID) | ORAL | Status: DC
  Administered 2014-12-19 – 2014-12-20 (×3): 500 mg via ORAL
  Filled 2014-12-19 (×5): qty 1

## 2014-12-19 MED ORDER — WITCH HAZEL-GLYCERIN EX PADS: 1.0000 "application " | MEDICATED_PAD | CUTANEOUS | Status: DC | PRN

## 2014-12-19 MED ORDER — ACETAMINOPHEN 325 MG PO TABS
650.0000 mg | ORAL_TABLET | ORAL | Status: DC | PRN
  Administered 2014-12-20 (×2): 650 mg via ORAL
  Filled 2014-12-19 (×2): qty 2

## 2014-12-19 MED ORDER — MAGNESIUM SULFATE 50 % IJ SOLN
2.0000 g/h | INTRAVENOUS | Status: AC
  Administered 2014-12-19: 2 g/h via INTRAVENOUS
  Filled 2014-12-19 (×2): qty 80

## 2014-12-19 MED ORDER — ONDANSETRON HCL 4 MG/2ML IJ SOLN
INTRAMUSCULAR | Status: AC
  Filled 2014-12-19: qty 2

## 2014-12-19 MED ORDER — IBUPROFEN 600 MG PO TABS
600.0000 mg | ORAL_TABLET | Freq: Four times a day (QID) | ORAL | Status: DC
  Administered 2014-12-19: 600 mg via ORAL
  Filled 2014-12-19: qty 1

## 2014-12-19 MED ORDER — ZOLPIDEM TARTRATE 5 MG PO TABS: 5.0000 mg | ORAL_TABLET | Freq: Every evening | ORAL | Status: DC | PRN

## 2014-12-19 MED ORDER — SCOPOLAMINE 1 MG/3DAYS TD PT72
MEDICATED_PATCH | TRANSDERMAL | Status: AC
  Filled 2014-12-19: qty 1

## 2014-12-19 MED ORDER — ONDANSETRON HCL 4 MG/2ML IJ SOLN: 4.0000 mg | INTRAMUSCULAR | Status: DC | PRN

## 2014-12-19 MED ORDER — DIPHENHYDRAMINE HCL 25 MG PO CAPS: 25.0000 mg | ORAL_CAPSULE | Freq: Four times a day (QID) | ORAL | Status: DC | PRN

## 2014-12-19 MED ORDER — MORPHINE SULFATE 0.5 MG/ML IJ SOLN
INTRAMUSCULAR | Status: AC
  Filled 2014-12-19: qty 100

## 2014-12-19 MED ORDER — LANOLIN HYDROUS EX OINT: TOPICAL_OINTMENT | CUTANEOUS | Status: DC | PRN

## 2014-12-19 MED ORDER — SIMETHICONE 80 MG PO CHEW: 80.0000 mg | CHEWABLE_TABLET | ORAL | Status: DC | PRN

## 2014-12-19 MED ORDER — OXYTOCIN 10 UNIT/ML IJ SOLN
INTRAMUSCULAR | Status: AC
  Filled 2014-12-19: qty 4

## 2014-12-19 MED ORDER — DEXAMETHASONE SODIUM PHOSPHATE 4 MG/ML IJ SOLN
INTRAMUSCULAR | Status: AC
  Filled 2014-12-19: qty 1

## 2014-12-19 MED ORDER — DIBUCAINE 1 % RE OINT
1.0000 "application " | TOPICAL_OINTMENT | RECTAL | Status: DC | PRN
  Filled 2014-12-19: qty 28

## 2014-12-19 MED ORDER — PRENATAL MULTIVITAMIN CH
1.0000 | ORAL_TABLET | Freq: Every day | ORAL | Status: DC
  Administered 2014-12-19 – 2014-12-20 (×2): 1 via ORAL
  Filled 2014-12-19 (×2): qty 1

## 2014-12-19 MED ORDER — SODIUM BICARBONATE 8.4 % IV SOLN
INTRAVENOUS | Status: AC
  Filled 2014-12-19: qty 50

## 2014-12-19 MED ORDER — ONDANSETRON HCL 4 MG PO TABS: 4.0000 mg | ORAL_TABLET | ORAL | Status: DC | PRN

## 2014-12-19 MED ORDER — PHENYLEPHRINE 8 MG IN D5W 100 ML (0.08MG/ML) PREMIX OPTIME
INJECTION | INTRAVENOUS | Status: AC
  Filled 2014-12-19: qty 100

## 2014-12-19 MED ORDER — LIDOCAINE-EPINEPHRINE (PF) 2 %-1:200000 IJ SOLN
INTRAMUSCULAR | Status: AC
  Filled 2014-12-19: qty 20

## 2014-12-19 MED ORDER — SENNOSIDES-DOCUSATE SODIUM 8.6-50 MG PO TABS
2.0000 | ORAL_TABLET | ORAL | Status: DC
  Administered 2014-12-20: 2 via ORAL
  Filled 2014-12-19: qty 2

## 2014-12-19 MED ORDER — BENZOCAINE-MENTHOL 20-0.5 % EX AERO
1.0000 "application " | INHALATION_SPRAY | CUTANEOUS | Status: DC | PRN
  Filled 2014-12-19: qty 56

## 2014-12-19 NOTE — Progress Notes (Signed)
Father of baby noted to be swaying with eyes closed with baby in arms. Nurse gave baby to mother. Father's condition also noted by smart start nurse.

## 2014-12-19 NOTE — Progress Notes (Signed)
Phenylephrine decreased to 73mcg/hr

## 2014-12-19 NOTE — Op Note (Signed)
Preoperative diagnosis:  Postpartum left vulvar hematoma, unstable                                         Hypotension  Postoperative diagnosis: Same as above  Procedure: Evacuation of left vulvar hematoma and complex multilayer repair of the left vulva  Surgeon:EURE,LUTHER H, MD  Anesthesia: Epidural  Findings: Patient delivered vaginally at 00 32 and had a first-degree perineal laceration.  At approximately 0300 I was called to the patient's postpartum room and told that she had a large vulvar hematoma.  When I arrived and evaluated the clinical situation the patient had actually already obtaining easily evacuated the hematoma through the mucosa just outside her vaginal vestibule.  However it had reaccumulated and was approximately the size of a softball or grapefruit.  It was actively bleeding out of this previously spontaneously evacuated mucosal area.  As a result I be the decision to go to the operating room and do a formal evaluation evacuation and repair  Description of operation:  Patient was taken to the operating room and placed in the supine position.  She had her epidural dosed up to allow the vulvar surgery.  He was placed in dorsal lithotomy position.  She was prepped and draped in the usual sterile fashion.  I actually removed the Foley catheter because it was in the way of my repair.  The vulvar hematoma again was the size of a grapefruit or softball at this point and was actively bleeding.  I made an incision along the entire length of the hematoma and completely evacuated the hematoma I estimated it was 250 cc of clotted blood.  Again as is usually the case with the's air was no definitive light of bleeding but the entire vulvar bed was oozing.  As result I closed the deep layers with interrupted 0 Monocryl suture.  Because the amount of blood was so large size a portion of the vulva or event a significant anatomical discrepancy with the right side into also more effectively  tamponode the repair.  A 4 layer repair was performed.  0 Monocryl suture was used throughout.  The final layer was a through and through superficial layer tied in the outside to effect extrinsic pressure.  I placed a new catheter.  I held pressure for 5 minutes.  There was no further bleeding.  The tissue of course was quite poor and tears easily so the catheter will be left in place for a week.  There was no active bleeding at the end of the repair and closure.  Additionally there was no extension of the bleeding of the rectovaginal space into the retroperitoneal space her of the vulva superficially to the abdomen.  Estimated blood loss for the procedure was only 100 cc but there was a 250 cc hematoma in the vulva The bleeding that I saw in room 157 was probably another 500 cc total  She was given 2 g of Ancef prophylactically  Lazaro Arms, MD 12/19/2014 6:42 AM

## 2014-12-19 NOTE — Progress Notes (Signed)
  Recovery care complete, PT transferred to Antenatal 157, report given to Lowella Grip RN

## 2014-12-19 NOTE — Lactation Note (Signed)
This note was copied from the chart of Marie Harmonie Aversa. Lactation Consultation Note: Lactation Brochure given with basic teaching done. Infant is 63 hours old. He has has one good feeding at the breast and one bottle with 7ml of Similac. Mother plans to breast and bottle feed. Infant was fed with 1.5 ml of colostrum with a spoon. Assist mother with latching infant on in football hold. Infant sustained latch for 20 mins. And another 10 mins on alternate breast. Mother has semi-flat nipple that firm well with stimulation. GMOB ask for a hand pump. Reviewed use of hand pump. Advised mother to cue base fed. Reviewed cue card, cluster feeding and baby and me book on breastfeeding. Mother has been using a pacifier all day and has had a few attempts to breastfeed without success until now. Supply and demand explained to mother and grandmother.  Advised mother to limit use of pacifier. Instruct mother to feed infant 8-12 times in 24 hours and do frequent STS.  Patient Name: Marie Cochran [id number] Date: 12/19/2014 Reason for consult: Initial assessment   Maternal Data Has patient been taught Hand Expression?: Yes Does the patient have breastfeeding experience prior to this delivery?: No  Feeding Feeding Type: Breast Fed Length of feed: 10 min  LATCH Score/Interventions Latch: Grasps breast easily, tongue down, lips flanged, rhythmical sucking.  Audible Swallowing: Spontaneous and intermittent Intervention(s): Skin to skin;Hand expression  Type of Nipple: Flat  Comfort (Breast/Nipple): Soft / non-tender     Hold (Positioning): Assistance needed to correctly position infant at breast and maintain latch. Intervention(s): Support Pillows;Position options  LATCH Score: 8  Lactation Tools Discussed/Used     Consult Status Consult Status: Follow-up Date: 12/19/14 Follow-up type: In-patient    Marie Cochran [facility] 12/19/2014, 4:36 PM

## 2014-12-19 NOTE — Anesthesia Postprocedure Evaluation (Signed)
  Anesthesia Post-op Note  Patient: Marie Cochran  Procedure(s) Performed: Procedure(s) with comments: EVACUATION HEMATOMA (N/A) - scheduled this case per admitting due to MRN conflict  Patient Location: PACU and Antenatal  Anesthesia Type:Epidural  Level of Consciousness: awake, alert  and oriented  Airway and Oxygen Therapy: Patient Spontanous Breathing  Post-op Pain: none  Post-op Assessment: Post-op Vital signs reviewed, Patient's Cardiovascular Status Stable, Respiratory Function Stable, No signs of Nausea or vomiting, Adequate PO intake, Pain level controlled, No headache, No backache and Patient able to bend at knees    Post-op Vital Signs: Reviewed and stable  Last Vitals:  Filed Vitals:   12/19/14 1501  BP: 107/58  Pulse: 92  Temp:   Resp: 16    Complications: No apparent anesthesia complications

## 2014-12-19 NOTE — Progress Notes (Signed)
Phenylephrine discontinued.

## 2014-12-19 NOTE — Progress Notes (Signed)
Phenylephrine decreased to 10 mcg/min

## 2014-12-19 NOTE — Progress Notes (Signed)
Phenylephrine decreased to 20 mcg/min

## 2014-12-19 NOTE — Progress Notes (Signed)
Pt prepped for OR. Consent witnessed and signed for surgery.

## 2014-12-19 NOTE — Progress Notes (Addendum)
Dr Despina Hidden informed that patient has hematoma and bleeding. Acknowledged. Will be to assess patient.

## 2014-12-19 NOTE — Plan of Care (Signed)
Problem: Phase I Progression Outcomes Goal: Other Phase I Outcomes/Goals Outcome: Progressing Lactation consult

## 2014-12-19 NOTE — Progress Notes (Signed)
Arrival to Recovery

## 2014-12-19 NOTE — Progress Notes (Addendum)
Phenylephrine decreased to 36mcg/min, CRNA at bedside

## 2014-12-20 MED ORDER — IBUPROFEN 600 MG PO TABS: 600.0000 mg | ORAL_TABLET | Freq: Four times a day (QID) | ORAL | Status: AC

## 2014-12-20 MED ORDER — CEPHALEXIN 500 MG PO CAPS: 500.0000 mg | ORAL_CAPSULE | Freq: Two times a day (BID) | ORAL | Status: DC

## 2014-12-20 MED ORDER — OXYCODONE-ACETAMINOPHEN 5-325 MG PO TABS: 1.0000 | ORAL_TABLET | Freq: Four times a day (QID) | ORAL | Status: DC | PRN

## 2014-12-20 NOTE — Anesthesia Postprocedure Evaluation (Signed)
  Anesthesia Post-op Note  Patient: Marie Cochran  Procedure(s) Performed: Procedure(s) with comments: EVACUATION HEMATOMA (N/A) - scheduled this case per admitting due to MRN conflict  Patient Location: Antenatal  Anesthesia Type:Epidural  Level of Consciousness: awake  Airway and Oxygen Therapy: Patient Spontanous Breathing  Post-op Pain: mild  Post-op Assessment: Patient's Cardiovascular Status Stable and Respiratory Function Stable   LLE Sensation: No numbness, No pain, No tingling   RLE Sensation: No numbness, No pain, No tingling L Sensory Level: L2-Upper inner thigh, upper buttock R Sensory Level: L2-Upper inner thigh, upper buttock  Post-op Vital Signs: stable  Last Vitals:  Filed Vitals:   12/20/14 0815  BP: 114/65  Pulse: 98  Temp: 36.8 C  Resp: 18    Complications: No apparent anesthesia complications

## 2014-12-20 NOTE — Discharge Instructions (Signed)
Vaginal Delivery, Care After Refer to this sheet in the next few weeks. These discharge instructions provide you with information on caring for yourself after delivery. Your caregiver may also give you specific instructions. Your treatment has been planned according to the most current medical practices available, but problems sometimes occur. Call your caregiver if you have any problems or questions after you go home. HOME CARE INSTRUCTIONS 1. Take over-the-counter or prescription medicines only as directed by your caregiver or pharmacist. 2. Do not drink alcohol, especially if you are breastfeeding or taking medicine to relieve pain. 3. Do not chew or smoke tobacco. 4. Do not use illegal drugs. 5. Continue to use good perineal care. Good perineal care includes:  Wiping your perineum from front to back.  Keeping your perineum clean. 6. Do not use tampons or douche until your caregiver says it is okay. 7. Shower, wash your hair, and take tub baths as directed by your caregiver. 8. Wear a well-fitting bra that provides breast support. 9. Eat healthy foods. 10. Drink enough fluids to keep your urine clear or pale yellow. 11. Eat high-fiber foods such as whole grain cereals and breads, brown rice, beans, and fresh fruits and vegetables every day. These foods may help prevent or relieve constipation. 12. Follow your caregiver's recommendations regarding resumption of activities such as climbing stairs, driving, lifting, exercising, or traveling. 13. Talk to your caregiver about resuming sexual activities. Resumption of sexual activities is dependent upon your risk of infection, your rate of healing, and your comfort and desire to resume sexual activity. 14. Try to have someone help you with your household activities and your newborn for at least a few days after you leave the hospital. 15. Rest as much as possible. Try to rest or take a nap when your newborn is sleeping. 16. Increase your  activities gradually. 17. Keep all of your scheduled postpartum appointments. It is very important to keep your scheduled follow-up appointments. At these appointments, your caregiver will be checking to make sure that you are healing physically and emotionally. SEEK MEDICAL CARE IF:  1. You are passing large clots from your vagina. Save any clots to show your caregiver. 2. You have a foul smelling discharge from your vagina. 3. You have trouble urinating. 4. You are urinating frequently. 5. You have pain when you urinate. 6. You have a change in your bowel movements. 7. You have increasing redness, pain, or swelling near your vaginal incision (episiotomy) or vaginal tear. 8. You have pus draining from your episiotomy or vaginal tear. 9. Your episiotomy or vaginal tear is separating. 10. You have painful, hard, or reddened breasts. 11. You have a severe headache. 12. You have blurred vision or see spots. 13. You feel sad or depressed. 14. You have thoughts of hurting yourself or your newborn. 15. You have questions about your care, the care of your newborn, or medicines. 16. You are dizzy or light-headed. 17. You have a rash. 18. You have nausea or vomiting. 19. You were breastfeeding and have not had a menstrual period within 12 weeks after you stopped breastfeeding. 20. You are not breastfeeding and have not had a menstrual period by the 12th week after delivery. 21. You have a fever. SEEK IMMEDIATE MEDICAL CARE IF:  1. You have persistent pain. 2. You have chest pain. 3. You have shortness of breath. 4. You faint. 5. You have leg pain. 6. You have stomach pain. 7. Your vaginal bleeding saturates two or more sanitary pads  in 1 hour. MAKE SURE YOU:  1. Understand these instructions. 2. Will watch your condition. 3. Will get help right away if you are not doing well or get worse. Document Released: 04/09/2000 Document Revised: 08/27/2013 Document Reviewed: 12/08/2011 [facility]  Patient Information 2015 [place], [place]. This information is not intended to replace advice given to you by your health care provider. Make sure you discuss any questions you have with your health care provider.   Foley Catheter Care A Foley catheter is a soft, flexible tube. This tube is placed into your bladder to drain pee (urine). If you go home with this catheter in place, follow the instructions below. TAKING CARE OF THE CATHETER 18. Wash your hands with soap and water. 19. Put soap and water on a clean washcloth.  Clean the skin where the tube goes into your body.  Clean away from the tube site.  Never wipe toward the tube.  Clean the area using a circular motion.  Remove all the soap. Pat the area dry with a clean towel. For males, reposition the skin that covers the end of the penis (foreskin). 20. Attach the tube to your leg with tape or a leg strap. Do not stretch the tube tight. If you are using tape, remove any stickiness left behind by past tape you used. 21. Keep the drainage bag below your hips. Keep it off the floor. 22. Check your tube during the day. Make sure it is working and draining. Make sure the tube does not curl, twist, or bend. 23. Do not pull on the tube or try to take it out. TAKING CARE OF THE DRAINAGE BAGS You will have a large overnight drainage bag and a small leg bag. You may wear the overnight bag any time. Never wear the small bag at night. Follow the directions below. Emptying the Drainage Bag Empty your drainage bag when it is  - full or at least 2-3 times a day. 22. Wash your hands with soap and water. 23. Keep the drainage bag below your hips. 24. Hold the dirty bag over the toilet or clean container. 25. Open the pour spout at the bottom of the bag. Empty the pee into the toilet or container. Do not let the pour spout touch anything. 26. Clean the pour spout with a gauze pad or cotton ball that has rubbing alcohol on it. 27. Close the pour  spout. 28. Attach the bag to your leg with tape or a leg strap. 29. Wash your hands well. Changing the Drainage Bag Change your bag once a month or sooner if it starts to smell or look dirty.  8. Wash your hands with soap and water. 9. Pinch the rubber tube so that pee does not spill out. 10. Disconnect the catheter tube from the drainage tube at the connection valve. Do not let the tubes touch anything. 11. Clean the end of the catheter tube with an alcohol wipe. Clean the end of a the drainage tube with a different alcohol wipe. 12. Connect the catheter tube to the drainage tube of the clean drainage bag. 13. Attach the new bag to the leg with tape or a leg strap. Avoid attaching the new bag too tightly. 14. Wash your hands well. Cleaning the Drainage Bag 4. Wash your hands with soap and water. 5. Wash the bag in warm, soapy water. 6. Rinse the bag with warm water. 7. Fill the bag with a mixture of white vinegar and water (1 cup vinegar  to 1 quart warm water [.2 liter vinegar to 1 liter warm water]). Close the bag and soak it for 30 minutes in the solution. 8. Rinse the bag with warm water. 9. Hang the bag to dry with the pour spout open and hanging downward. 10. Store the clean bag (once it is dry) in a clean plastic bag. 11. Wash your hands well. PREVENT INFECTION  Wash your hands before and after touching your tube.  Take showers every day. Wash the skin where the tube enters your body. Do not take baths. Replace wet leg straps with dry ones, if this applies.  Do not use powders, sprays, or lotions on the genital area. Only use creams, lotions, or ointments as told by your doctor.  For females, wipe from front to back after going to the bathroom.  Drink enough fluids to keep your pee clear or pale yellow unless you are told not to have too much fluid (fluid restriction).  Do not let the drainage bag or tubing touch or lie on the floor.  Wear cotton underwear to keep the area  dry. GET HELP IF:  Your pee is cloudy or smells unusually bad.  Your tube becomes clogged.  You are not draining pee into the bag or your bladder feels full.  Your tube starts to leak. GET HELP RIGHT AWAY IF:  You have pain, puffiness (swelling), redness, or yellowish-white fluid (pus) where the tube enters the body.  You have pain in the belly (abdomen), legs, lower back, or bladder.  You have a fever.  You see blood fill the tube, or your pee is pink or red.  You feel sick to your stomach (nauseous), throw up (vomit), or have chills.  Your tube gets pulled out. MAKE SURE YOU:   Understand these instructions.  Will watch your condition.  Will get help right away if you are not doing well or get worse. Document Released: 08/07/2012 Document Revised: 08/27/2013 Document Reviewed: 08/07/2012 [facility] Patient Information 2015 [place], [place]. This information is not intended to replace advice given to you by your health care provider. Make sure you discuss any questions you have with your health care provider.

## 2014-12-20 NOTE — Discharge Summary (Signed)
OB Discharge Summary  Patient Name: Marie Cochran DOB: [birth year] MRN: [id number]  Date of admission: 12/18/2014 Delivering MD: Clare Gandy E   Date of discharge: No discharge date for patient encounter.  Admitting diagnosis: Induction of labor    Intrauterine pregnancy: [redacted]w[redacted]d     Secondary diagnosis: Preeclampsia     Discharge diagnosis: Term Pregnancy Delivered and Preeclampsia (mild)  Method of delivery:      Information for the patient's newborn:  Gracynn, Rajewski [[id number]]  Delivery Method: Vaginal, Spontaneous Delivery (Filed from Delivery Summary)                                                                                                     Post partum procedures:Evacuation of vulvar hematoma  Augmentation: AROM and Pitocin  Complications:Hemorrhage>102mL  Hospital course:  Induction of Labor With Vaginal Delivery   24 y.o. yo G1P1001 at [redacted]w[redacted]d was admitted to the hospital 12/18/2014 for induction of labor.  Indication for induction: Postdates.  Patient had an uncomplicated labor course as follows:  Mediations and procedures used include: Pitocin and AROM      Length of stage 3:                                       Patient had delivery of a Viable infant.  Information for the patient's newborn:  Nazaria, Riesen [[id number]]  Delivery Method: Vaginal, Spontaneous Delivery (Filed from Delivery Summary)   12/19/2014  Details of delivery can be found in separate delivery note.  Patient had a vulvar hematoma that was evacuated with a multilayer repair of left vulva.  She was on Magnesium for 24 hours and blood pressure has remained stable afterwards.  Patient is discharged home No discharge date for patient encounter.    Physical exam  Filed Vitals:   12/20/14 0201 12/20/14 0301 12/20/14 0423 12/20/14 0501  BP: 107/50 112/58 121/51 112/55  Pulse: 100 90 100 89  Temp:      TempSrc:      Resp: Height:      Weight:      SpO2:        General: alert, cooperative and no distress Lochia: appropriate Uterine Fundus: firm DVT Evaluation: No evidence of DVT seen on physical exam. Negative Homan's sign. No cords or calf tenderness. Labs: Lab Results  Component Value Date   WBC 21.0* 12/19/2014   HGB 9.1* 12/19/2014   HCT 26.4* 12/19/2014   MCV 90.1 12/19/2014   PLT 232 12/19/2014   CMP Latest Ref Rng 12/18/2014  Glucose 65 - 99 mg/dL 85  BUN 6 - 20 mg/dL 9  Creatinine 6.44 - 0.34 mg/dL 7.42  Sodium 595 - 638 mmol/L 137  Potassium 3.5 - 5.1 mmol/L 3.9  Chloride 101 - 111 mmol/L 107  CO2 22 - 32 mmol/L 20(L)  Calcium 8.9 - 10.3 mg/dL 9.3  Total Protein 6.5 - 8.1 g/dL 6.2(L)  Total Bilirubin 0.3 - 1.2 mg/dL 0.4  Alkaline Phos 38 - 126 U/L 105  AST 15 - 41 U/L 20  ALT 14 - 54 U/L 10(L)    Discharge instruction: per After Visit Summary and "Baby and Me Booklet".  Medications:   Medication List    TAKE these medications        acyclovir 400 MG tablet  Commonly known as:  ZOVIRAX  Take 1 tablet (400 mg total) by mouth 3 (three) times daily.     cephALEXin 500 MG capsule  Commonly known as:  KEFLEX  Take 1 capsule (500 mg total) by mouth every 12 (twelve) hours.     ibuprofen 600 MG tablet  Commonly known as:  ADVIL,MOTRIN  Take 1 tablet (600 mg total) by mouth every 6 (six) hours.     oxyCODONE-acetaminophen 5-325 MG per tablet  Commonly known as:  PERCOCET/ROXICET  Take 1 tablet by mouth every 6 (six) hours as needed (for pain scale 4-7).        Diet: routine diet  Activity: Advance as tolerated. Pelvic rest for 6 weeks.   Outpatient follow up:1 week  Postpartum contraception: Nexplanon  Newborn Data: Live born female  Birth Weight: 7 lb 3.3 oz (3270 g) APGAR: 5, 7  Baby Feeding: Breast Disposition:home with mother   12/20/2014 Candelaria Celeste JEHIEL, DO

## 2014-12-20 NOTE — Progress Notes (Signed)
While attempting to empty patient's foley catheter, father of baby in bathroom. He was noted to be in bathroom for an extended length of time. Nurse knocked on door. No answer was given. Patient and her mother was in room at this time. Patient stated that he had been sick today. Informed family that I was going to unlock bathroom door. They were in agreement. Attempted knocking and calling his name multiple times to no avail. Bathroom unlocked, father of baby noted to be sitting on toilet slumped over. Multiple attempts made to wake father of baby, such as shaking and calling his name. He finally aroused with slurred speech and said that he was ok. House coverage notified. After coming out of bathroom, he continued to have slurred speech and unsteady gait. Asked if he needed anything, he stated he was okay.

## 2014-12-21 ENCOUNTER — Other Ambulatory Visit: Payer: Self-pay | Admitting: Family Medicine

## 2014-12-21 ENCOUNTER — Telehealth: Payer: Self-pay | Admitting: Family Medicine

## 2014-12-21 MED ORDER — OXYCODONE-ACETAMINOPHEN 5-325 MG PO TABS: 1.0000 | ORAL_TABLET | Freq: Four times a day (QID) | ORAL | Status: DC | PRN

## 2014-12-21 NOTE — Clinical Social Work Maternal (Signed)
  CLINICAL SOCIAL WORK MATERNAL/CHILD NOTE  Patient Details  Name: Marie Cochran MRN: [id number] Date of Birth: 05/01/1990  Date:  12/21/2014  Clinical Social Worker Initiating Note:  Norlene Duel, LCSW Date/ Time Initiated:  12/21/14/1230     Child's Name:  Marie Cochran   Legal Guardian:   (Parents Marie Cochran and Marie Cochran)   Need for Interpreter:  None   Date of Referral:  12/20/14     Reason for Referral:  Other (Comment)   Referral Source:  Central Nursery   Address:  [address]  [address]  [place], [place] [address]  Phone number:   [phone])   Household Members:  Relatives   Natural Supports (not living in the home):  Spouse/significant other, Immediate Family, Extended Family, Friends   Chiropodist: None   Employment:     Type of Work:  ([employer] is employed)   Education:      Pensions consultant:  [employer]   Other Resources:  [employer], Physicist, medical    Cultural/Religious Considerations Which May Impact Care:  none noted  Strengths:  Ability to meet basic needs , Home prepared for child , Pediatrician chosen    Risk Factors/Current Problems:   (Hx of mental illness)   Cognitive State:  Alert , Able to Concentrate    Mood/Affect:  Happy    CSW Assessment:  Acknowledged order for social work consult to assess mother's hx of mental illness.     Met with mother who was pleasant and receptive to social work.    Mother states that and FOB were residing together, but she and newborn will be staying with her father.    She reports hx of anxiety and depression and was started on medication about 2 months ago.  She notes that the medication was effective and she plan to continue talking it.     She denies any current symptoms of depression or anxiety.   She also denies hx of illicit drug use.  Per chart review, father was difficult to arouse, and slow and unsteady getting out of bed.  Questioned mother about this.  She notes that father is not on any medication  and is not using illicit drugs.  Mother notes that he has a seizure disorder that is not being managed by a physician because of financial issues.   Informed that they are making plans to address this in the near future.  Meanwhile, informed that are taking the necessary steps to ensure newborn's safety when FOB is caring for him.   She reports having an adequate support system.  Encouraged continued mental health treatment.   Mother informed of social work Fish farm manager.   CSW Plan/Description:     Provided information and resources on PP Depression No further intervention required No barriers to discharge   [name] J, LCSW 12/21/2014, 4:40 PM

## 2014-12-22 LAB — TYPE AND SCREEN
ABO/RH(D): A POS
ANTIBODY SCREEN: NEGATIVE
UNIT DIVISION: 0
Unit division: 0

## 2014-12-24 NOTE — Telephone Encounter (Signed)
Error

## 2014-12-25 ENCOUNTER — Encounter: Payer: Medicaid Other | Admitting: Family Medicine

## 2014-12-25 ENCOUNTER — Ambulatory Visit (INDEPENDENT_AMBULATORY_CARE_PROVIDER_SITE_OTHER): Payer: Medicaid Other | Admitting: Obstetrics & Gynecology

## 2014-12-25 ENCOUNTER — Encounter: Payer: Self-pay | Admitting: Obstetrics & Gynecology

## 2014-12-25 MED ORDER — OXYCODONE-ACETAMINOPHEN 5-325 MG PO TABS: 1.0000 | ORAL_TABLET | Freq: Four times a day (QID) | ORAL | Status: DC | PRN

## 2014-12-25 NOTE — Patient Instructions (Signed)

## 2014-12-25 NOTE — Progress Notes (Signed)
Subjective:needs foley cath removed     Marie Cochran is a 24 y.o. female who presents for a postpartum visit. She is 6 days postpartum following a spontaneous vaginal delivery. I have fully reviewed the prenatal and intrapartum course. The delivery was at 41 gestational weeks. Outcome: spontaneous vaginal delivery. Anesthesia: epidural. Postpartum course has been complicated by left vulvar hematoma evacuated by Dr. Despina Hidden. Baby's course has been good. Baby is feeding by bottle - [employer] Good Start. Bleeding mod lochia. Bowel function is normal. Bladder function is foley in place. Patient is not sexually active. Contraception method is  .   The following portions of the patient's history were reviewed and updated as appropriate: allergies, current medications, past family history, past medical history, past social history, past surgical history and problem list.  Review of Systems Pertinent items are noted in HPI.   Objective:    BP 127/80 mmHg  Pulse 102  Temp(Src) 98.5 F (36.9 C) (Oral)  Ht 5' (1.524 m)  Wt 147 lb 14.4 oz (67.087 kg)  BMI 28.88 kg/m2  LMP 03/06/2014  General:           Foley catheter removered       positive for laceration on the leftmild swelling  Vagina: not evaluated  Cervix:     Corpus: not examined  Adnexa:  not evaluated  Rectal Exam: Not performed.        Assessment:     6 day postpartum exam. Vulvar hematoma doing well postop  Plan:     Follow up in: 5 weeks or as needed.    Adam Phenix, MD 12/25/2014

## 2015-01-01 ENCOUNTER — Telehealth: Payer: Self-pay | Admitting: Family Medicine

## 2015-01-01 NOTE — Telephone Encounter (Signed)
Pt calling to request a refill on oxycodone. Would like for PCP to contact her at the earliest convenience as she has a few questions about this as well and would like to discuss it with PCP. Sadie Reynolds, ASA

## 2015-01-02 MED ORDER — OXYCODONE-ACETAMINOPHEN 5-325 MG PO TABS: 1.0000 | ORAL_TABLET | Freq: Four times a day (QID) | ORAL | Status: DC | PRN

## 2015-01-02 NOTE — Telephone Encounter (Signed)
Spoke with patient about her pain. Recently had vaginal delivery and vulvar hematoma that was evacuated. She is taking 600 mg ibuprofen, using ice and taking the narcotic when the pain gets to be too much. She notices the pain more when she has to move around a lot. She tries to rest but is at home taking care of her newborn. Will give her # more tablets of percocet.   Myra Rude, MD PGY-3, [facility] 01/02/2015, 8:16 AM

## 2015-01-30 ENCOUNTER — Ambulatory Visit: Payer: Self-pay | Admitting: Student

## 2015-09-13 ENCOUNTER — Emergency Department (HOSPITAL_COMMUNITY)
Admission: EM | Admit: 2015-09-13 | Discharge: 2015-09-14 | Disposition: A | Discharge status: Home / Self Care | Payer: Self-pay | Attending: Emergency Medicine | Admitting: Emergency Medicine

## 2015-09-13 ENCOUNTER — Encounter (HOSPITAL_COMMUNITY): Payer: Self-pay | Admitting: Emergency Medicine

## 2015-09-13 ENCOUNTER — Emergency Department (HOSPITAL_COMMUNITY): Payer: Self-pay

## 2015-09-13 DIAGNOSIS — R000 Tachycardia, unspecified: Secondary | ICD-10-CM | POA: Insufficient documentation

## 2015-09-13 DIAGNOSIS — S80812A Abrasion, left lower leg, initial encounter: Secondary | ICD-10-CM | POA: Insufficient documentation

## 2015-09-13 DIAGNOSIS — Y9389 Activity, other specified: Secondary | ICD-10-CM | POA: Insufficient documentation

## 2015-09-13 DIAGNOSIS — S90811A Abrasion, right foot, initial encounter: Secondary | ICD-10-CM | POA: Insufficient documentation

## 2015-09-13 DIAGNOSIS — T07XXXA Unspecified multiple injuries, initial encounter: Secondary | ICD-10-CM

## 2015-09-13 DIAGNOSIS — Y998 Other external cause status: Secondary | ICD-10-CM | POA: Insufficient documentation

## 2015-09-13 DIAGNOSIS — S80212A Abrasion, left knee, initial encounter: Secondary | ICD-10-CM | POA: Insufficient documentation

## 2015-09-13 DIAGNOSIS — S50812A Abrasion of left forearm, initial encounter: Secondary | ICD-10-CM | POA: Insufficient documentation

## 2015-09-13 DIAGNOSIS — Z3202 Encounter for pregnancy test, result negative: Secondary | ICD-10-CM | POA: Insufficient documentation

## 2015-09-13 DIAGNOSIS — F419 Anxiety disorder, unspecified: Secondary | ICD-10-CM | POA: Insufficient documentation

## 2015-09-13 DIAGNOSIS — Y92481 Parking lot as the place of occurrence of the external cause: Secondary | ICD-10-CM | POA: Insufficient documentation

## 2015-09-13 DIAGNOSIS — S40212A Abrasion of left shoulder, initial encounter: Secondary | ICD-10-CM | POA: Insufficient documentation

## 2015-09-13 LAB — CBG MONITORING, ED: GLUCOSE-CAPILLARY: 81 mg/dL (ref 65–99)

## 2015-09-13 MED ORDER — SODIUM CHLORIDE 0.9 % IV BOLUS (SEPSIS)
1000.0000 mL | Freq: Once | INTRAVENOUS | Status: AC
  Administered 2015-09-13: 1000 mL via INTRAVENOUS

## 2015-09-13 MED ORDER — IOPAMIDOL (ISOVUE-300) INJECTION 61%
INTRAVENOUS | Status: AC
  Administered 2015-09-14: 100 mL
  Filled 2015-09-13: qty 100

## 2015-09-13 MED ORDER — CEFAZOLIN SODIUM-DEXTROSE 2-4 GM/100ML-% IV SOLN
2.0000 g | Freq: Once | INTRAVENOUS | Status: AC
  Administered 2015-09-13: 2 g via INTRAVENOUS

## 2015-09-13 MED ORDER — CEFAZOLIN SODIUM-DEXTROSE 2-4 GM/100ML-% IV SOLN
INTRAVENOUS | Status: AC
  Filled 2015-09-13: qty 100

## 2015-09-13 MED ORDER — MORPHINE SULFATE (PF) 2 MG/ML IV SOLN
INTRAVENOUS | Status: AC
  Administered 2015-09-13: 2 mg via INTRAVENOUS
  Filled 2015-09-13: qty 2

## 2015-09-13 MED ORDER — SODIUM CHLORIDE 0.9 % IV SOLN
INTRAVENOUS | Status: AC | PRN
  Administered 2015-09-13: 20 mL/h via INTRAVENOUS

## 2015-09-13 MED ORDER — MORPHINE SULFATE (PF) 4 MG/ML IV SOLN
4.0000 mg | Freq: Once | INTRAVENOUS | Status: AC
  Administered 2015-09-14: 4 mg via INTRAVENOUS
  Filled 2015-09-13 (×2): qty 1

## 2015-09-13 MED ORDER — CEFAZOLIN SODIUM 1-5 GM-% IV SOLN: 1.0000 g | Freq: Once | INTRAVENOUS | Status: DC
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
## 2015-09-13 NOTE — ED Notes (Signed)
Dr. Silverio LayYao called to the bedside to reassess patient. Preparing for CT scan
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
## 2015-09-13 NOTE — ED Notes (Signed)
Patient transported to X-ray 

## 2015-09-13 NOTE — ED Notes (Signed)
Patient suddenly complained of being short of breath prior to administering morphine. Noted heart rate dropped to 40s, bp down.

## 2015-09-13 NOTE — ED Notes (Signed)
Called Dr. Silverio LayYao, as patient is reporting more pain. MD acknowledges, gives verbal order for 4mg  of morphine.

## 2015-09-14 ENCOUNTER — Emergency Department (HOSPITAL_COMMUNITY): Payer: Self-pay

## 2015-09-14 ENCOUNTER — Encounter (HOSPITAL_COMMUNITY): Payer: Self-pay | Admitting: Radiology

## 2015-09-14 LAB — COMPREHENSIVE METABOLIC PANEL
ALK PHOS: 45 U/L (ref 38–126)
ALT: 26 U/L (ref 14–54)
AST: 29 U/L (ref 15–41)
Albumin: 4.4 g/dL (ref 3.5–5.0)
Anion gap: 19 — ABNORMAL HIGH (ref 5–15)
BUN: 11 mg/dL (ref 6–20)
CALCIUM: 9.7 mg/dL (ref 8.9–10.3)
CHLORIDE: 105 mmol/L (ref 101–111)
CO2: 19 mmol/L — AB (ref 22–32)
CREATININE: 0.63 mg/dL (ref 0.44–1.00)
GFR calc Af Amer: >60 mL/min (ref >60)
Glucose, Bld: 97 mg/dL (ref 65–99)
Potassium: 3.5 mmol/L (ref 3.5–5.1)
SODIUM: 143 mmol/L (ref 135–145)
Total Bilirubin: 0.5 mg/dL (ref 0.3–1.2)
Total Protein: 7.6 g/dL (ref 6.5–8.1)

## 2015-09-14 LAB — CBC WITH DIFFERENTIAL/PLATELET
BASOS ABS: 0 10*3/uL (ref 0.0–0.1)
Basophils Relative: 0 %
EOS PCT: 1 %
Eosinophils Absolute: 0.1 10*3/uL (ref 0.0–0.7)
HCT: 42.5 % (ref 36.0–46.0)
HEMOGLOBIN: 13.9 g/dL (ref 12.0–15.0)
LYMPHS ABS: 2 10*3/uL (ref 0.7–4.0)
LYMPHS PCT: 15 %
MCH: 27.7 pg (ref 26.0–34.0)
MCHC: 32.7 g/dL (ref 30.0–36.0)
MCV: 84.8 fL (ref 78.0–100.0)
Monocytes Absolute: 0.6 10*3/uL (ref 0.1–1.0)
Monocytes Relative: 4 %
NEUTROS ABS: 10.4 10*3/uL — AB (ref 1.7–7.7)
NEUTROS PCT: 80 %
PLATELETS: 336 10*3/uL (ref 150–400)
RBC: 5.01 MIL/uL (ref 3.87–5.11)
RDW: 13.6 % (ref 11.5–15.5)
WBC: 13 10*3/uL — AB (ref 4.0–10.5)

## 2015-09-14 LAB — I-STAT BETA HCG BLOOD, ED (MC, WL, AP ONLY): I-stat hCG, quantitative: <5 m[IU]/mL (ref <5)

## 2015-09-14 MED ORDER — BACITRACIN 500 UNIT/GM EX OINT: 1.0000 "application " | TOPICAL_OINTMENT | Freq: Two times a day (BID) | CUTANEOUS | Status: AC

## 2015-09-14 MED ORDER — HYDROCODONE-ACETAMINOPHEN 5-325 MG PO TABS: 1.0000 | ORAL_TABLET | ORAL | Status: AC | PRN

## 2015-09-14 MED ORDER — CEPHALEXIN 500 MG PO CAPS: 500.0000 mg | ORAL_CAPSULE | Freq: Two times a day (BID) | ORAL | Status: AC

## 2015-09-14 NOTE — ED Notes (Signed)
Wounds have dressings applied and teaching for home care. Extra supplies provided as well. Patient able to tolerate PO intake with water as PO challenge. Ambulated in hallway with this RN as supervision, slow gait, with guarding of right leg. Ace wrap applied for support.

## 2015-09-14 NOTE — Discharge Instructions (Signed)
Apply bacitracin or other antibiotic ointment to your abrasions twice daily. Keep wounds covered with clean gauze dressing for the next 48 hours. Follow up with your doctor in 3-4 days for wound re-check.

## 2015-09-14 NOTE — ED Notes (Signed)
Patient is currently in CT 

## 2015-09-15 ENCOUNTER — Encounter: Payer: Self-pay | Admitting: Obstetrics & Gynecology

## 2015-09-24 ENCOUNTER — Encounter (HOSPITAL_COMMUNITY): Payer: Self-pay

## 2015-09-24 ENCOUNTER — Emergency Department (HOSPITAL_COMMUNITY)
Admission: EM | Admit: 2015-09-24 | Discharge: 2015-09-24 | Disposition: A | Discharge status: Home / Self Care | Payer: BLUE CROSS/BLUE SHIELD | Attending: Emergency Medicine | Admitting: Emergency Medicine

## 2015-09-24 ENCOUNTER — Emergency Department (HOSPITAL_COMMUNITY): Payer: BLUE CROSS/BLUE SHIELD

## 2015-09-24 DIAGNOSIS — S80812A Abrasion, left lower leg, initial encounter: Secondary | ICD-10-CM | POA: Diagnosis present

## 2015-09-24 DIAGNOSIS — Z792 Long term (current) use of antibiotics: Secondary | ICD-10-CM | POA: Diagnosis not present

## 2015-09-24 DIAGNOSIS — Y939 Activity, unspecified: Secondary | ICD-10-CM | POA: Diagnosis not present

## 2015-09-24 DIAGNOSIS — Y92410 Unspecified street and highway as the place of occurrence of the external cause: Secondary | ICD-10-CM | POA: Insufficient documentation

## 2015-09-24 DIAGNOSIS — Z79899 Other long term (current) drug therapy: Secondary | ICD-10-CM | POA: Insufficient documentation

## 2015-09-24 DIAGNOSIS — Y999 Unspecified external cause status: Secondary | ICD-10-CM | POA: Insufficient documentation

## 2015-09-24 DIAGNOSIS — S9032XA Contusion of left foot, initial encounter: Secondary | ICD-10-CM | POA: Insufficient documentation

## 2015-09-24 DIAGNOSIS — S9002XA Contusion of left ankle, initial encounter: Secondary | ICD-10-CM | POA: Insufficient documentation

## 2015-09-24 DIAGNOSIS — S80812D Abrasion, left lower leg, subsequent encounter: Secondary | ICD-10-CM

## 2015-09-24 DIAGNOSIS — R609 Edema, unspecified: Secondary | ICD-10-CM

## 2015-09-24 DIAGNOSIS — M79605 Pain in left leg: Secondary | ICD-10-CM
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
## 2015-09-24 MED ORDER — OXYCODONE-ACETAMINOPHEN 5-325 MG PO TABS: 1.0000 | ORAL_TABLET | Freq: Four times a day (QID) | ORAL | Status: AC | PRN

## 2015-09-24 MED ORDER — OXYCODONE-ACETAMINOPHEN 5-325 MG PO TABS
2.0000 | ORAL_TABLET | Freq: Once | ORAL | Status: AC
  Administered 2015-09-24: 2 via ORAL
  Filled 2015-09-24: qty 2

## 2015-09-24 MED ORDER — CEPHALEXIN 500 MG PO CAPS: 500.0000 mg | ORAL_CAPSULE | Freq: Two times a day (BID) | ORAL | Status: AC
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
## 2015-09-24 NOTE — Discharge Instructions (Signed)
Dressing Change A dressing is a material placed over wounds. It keeps the wound clean, dry, and protected from further injury. This provides an environment that favors wound healing.  BEFORE YOU BEGIN  Get your supplies together. Things you may need include:  Saline solution.  Flexible gauze dressing.  Medicated cream.  Tape.  Gloves.  Abdominal dressing pads.  Gauze squares.  Plastic bags.  Take pain medicine 30 minutes before the dressing change if you need it.  Take a shower before you do the first dressing change of the day. Use plastic wrap or a plastic bag to prevent the dressing from getting wet. REMOVING YOUR OLD DRESSING   Wash your hands with soap and water. Dry your hands with a clean towel.  Put on your gloves.  Remove any tape.  Carefully remove the old dressing. If the dressing sticks, you may dampen it with warm water to loosen it, or follow your caregiver's specific directions.  Remove any gauze or packing tape that is in your wound.  Take off your gloves.  Put the gloves, tape, gauze, or any packing tape into a plastic bag. CHANGING YOUR DRESSING  Open the supplies.  Take the cap off the saline solution.  Open the gauze package so that the gauze remains on the inside of the package.  Put on your gloves.  Clean your wound as told by your caregiver.  If you have been told to keep your wound dry, follow those instructions.  Your caregiver may tell you to do one or more of the following:  Pick up the gauze. Pour the saline solution over the gauze. Squeeze out the extra saline solution.  Put medicated cream or other medicine on your wound if you have been told to do so.  Put the solution soaked gauze only in your wound, not on the skin around it.  Pack your wound loosely or as told by your caregiver.  Put dry gauze on your wound.  Put abdominal dressing pads over the dry gauze if your wet gauze soaks through.  Tape the abdominal dressing  pads in place so they will not fall off. Do not wrap the tape completely around the affected part (arm, leg, abdomen).  Wrap the dressing pads with a flexible gauze dressing to secure it in place.  Take off your gloves. Put them in the plastic bag with the old dressing. Tie the bag shut and throw it away.  Keep the dressing clean and dry until your next dressing change.  Wash your hands. SEEK MEDICAL CARE IF:  Your skin around the wound looks red.  Your wound feels more tender or sore.  You see pus in the wound.  Your wound smells bad.  You have a fever.  Your skin around the wound has a rash that itches and burns.  You see black or yellow skin in your wound that was not there before.  You feel nauseous, throw up, and feel very tired.   This information is not intended to replace advice given to you by your health care provider. Make sure you discuss any questions you have with your health care provider.   Document Released: 05/20/2004 Document Revised: 07/05/2011 Document Reviewed: 02/22/2011 Elsevier Interactive Patient Education 2016 [employer]  RICE for Routine Care of Injuries Theroutine careofmanyinjuriesincludes rest, ice, compression, and elevation (RICE therapy). RICE therapy is often recommended for injuries to soft tissues, such as a muscle strain, ligament injuries, bruises, and overuse injuries. It can also be  used for some bony injuries. Using RICE therapy can help to relieve pain, lessen swelling, and enable your body to heal. Rest Rest is required to allow your body to heal. This usually involves reducing your normal activities and avoiding use of the injured part of your body. Generally, you can return to your normal activities when you are comfortable and have been given permission by your health care provider. Ice Icing your injury helps to keep the swelling down, and it lessens pain. Do not apply ice directly to your skin.  Put ice in a plastic  bag.  Place a towel between your skin and the bag.  Leave the ice on for 20 minutes, 2-3 times a day. Do this for as long as you are directed by your health care provider. Compression Compression means putting pressure on the injured area. Compression helps to keep swelling down, gives support, and helps with discomfort. Compression may be done with an elastic bandage. If an elastic bandage has been applied, follow these general tips:  Remove and reapply the bandage every 3-4 hours or as directed by your health care provider.  Make sure the bandage is not wrapped too tightly, because this can cut off circulation. If part of your body beyond the bandage becomes blue, numb, cold, swollen, or more painful, your bandage is most likely too tight. If this occurs, remove your bandage and reapply it more loosely.  See your health care provider if the bandage seems to be making your problems worse rather than better. Elevation Elevation means keeping the injured area raised. This helps to lessen swelling and decrease pain. If possible, your injured area should be elevated at or above the level of your heart or the center of your chest. WHEN SHOULD I SEEK MEDICAL CARE? You should seek medical care if:  Your pain and swelling continue.  Your symptoms are getting worse rather than improving. These symptoms may indicate that further evaluation or further X-rays are needed. Sometimes, X-rays may not show a small broken bone (fracture) until a number of days later. Make a follow-up appointment with your health care provider. WHEN SHOULD I SEEK IMMEDIATE MEDICAL CARE? You should seek immediate medical care if:  You have sudden severe pain at or below the area of your injury.  You have redness or increased swelling around your injury.  You have tingling or numbness at or below the area of your injury that does not improve after you remove the elastic bandage.   This information is not intended to  replace advice given to you by your health care provider. Make sure you discuss any questions you have with your health care provider.   Document Released: 07/25/2000 Document Revised: 01/01/2015 Document Reviewed: 03/20/2014 Elsevier Interactive Patient Education [employer]
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
## 2015-09-24 NOTE — ED Notes (Signed)
PA at bedside.

## 2015-09-24 NOTE — ED Notes (Signed)
Bed: WA16 Expected date:  Expected time:  Means of arrival:  Comments: Pt in room 

## 2016-05-04 ENCOUNTER — Ambulatory Visit: Payer: BLUE CROSS/BLUE SHIELD | Admitting: Obstetrics

## 2018-03-26 DEATH — deceased
